# Patient Record
Sex: Female | Born: 1960 | Race: Black or African American | Hispanic: No | Marital: Married | Smoking: Never smoker
Health system: Southern US, Community
[De-identification: ages and names within clinical notes are randomized; demographics above are authoritative.]

## PROBLEM LIST (undated history)

## (undated) DIAGNOSIS — T7840XA Allergy, unspecified, initial encounter: Secondary | ICD-10-CM

## (undated) DIAGNOSIS — M1990 Unspecified osteoarthritis, unspecified site: Secondary | ICD-10-CM

## (undated) DIAGNOSIS — R0602 Shortness of breath: Secondary | ICD-10-CM

## (undated) DIAGNOSIS — D649 Anemia, unspecified: Secondary | ICD-10-CM

## (undated) DIAGNOSIS — K219 Gastro-esophageal reflux disease without esophagitis: Secondary | ICD-10-CM

## (undated) DIAGNOSIS — R931 Abnormal findings on diagnostic imaging of heart and coronary circulation: Secondary | ICD-10-CM

## (undated) DIAGNOSIS — F419 Anxiety disorder, unspecified: Secondary | ICD-10-CM

## (undated) DIAGNOSIS — I10 Essential (primary) hypertension: Secondary | ICD-10-CM

## (undated) DIAGNOSIS — G5600 Carpal tunnel syndrome, unspecified upper limb: Secondary | ICD-10-CM

## (undated) HISTORY — DX: Allergy, unspecified, initial encounter: T78.40XA

## (undated) HISTORY — PX: OTHER SURGICAL HISTORY: SHX169

## (undated) HISTORY — PX: BACK SURGERY: SHX140

## (undated) HISTORY — PX: ANTERIOR CRUCIATE LIGAMENT REPAIR: SHX115

## (undated) HISTORY — DX: Unspecified osteoarthritis, unspecified site: M19.90

## (undated) HISTORY — PX: NECK SURGERY: SHX720

## (undated) HISTORY — DX: Abnormal findings on diagnostic imaging of heart and coronary circulation: R93.1

## (undated) HISTORY — DX: Essential (primary) hypertension: I10

## (undated) HISTORY — DX: Anxiety disorder, unspecified: F41.9

## (undated) HISTORY — PX: COLONOSCOPY: SHX174

---

## 1997-11-10 ENCOUNTER — Emergency Department (HOSPITAL_COMMUNITY): Admission: EM | Admit: 1997-11-10 | Discharge: 1997-11-10 | Payer: Self-pay | Admitting: Emergency Medicine

## 1997-12-26 ENCOUNTER — Other Ambulatory Visit: Admission: RE | Admit: 1997-12-26 | Discharge: 1997-12-26 | Payer: Self-pay | Admitting: Obstetrics

## 1998-06-28 ENCOUNTER — Emergency Department (HOSPITAL_COMMUNITY): Admission: EM | Admit: 1998-06-28 | Discharge: 1998-06-28 | Payer: Self-pay | Admitting: Emergency Medicine

## 1998-10-01 ENCOUNTER — Other Ambulatory Visit: Admission: RE | Admit: 1998-10-01 | Discharge: 1998-10-01 | Payer: Self-pay | Admitting: Obstetrics

## 2000-08-16 ENCOUNTER — Other Ambulatory Visit: Admission: RE | Admit: 2000-08-16 | Discharge: 2000-08-16 | Payer: Self-pay | Admitting: Obstetrics

## 2000-08-27 ENCOUNTER — Ambulatory Visit (HOSPITAL_COMMUNITY): Admission: RE | Admit: 2000-08-27 | Discharge: 2000-08-27 | Payer: Self-pay | Admitting: Obstetrics

## 2000-08-27 ENCOUNTER — Encounter: Payer: Self-pay | Admitting: Obstetrics

## 2002-01-24 ENCOUNTER — Encounter: Payer: Self-pay | Admitting: Emergency Medicine

## 2002-01-24 ENCOUNTER — Emergency Department (HOSPITAL_COMMUNITY): Admission: EM | Admit: 2002-01-24 | Discharge: 2002-01-24 | Payer: Self-pay | Admitting: Emergency Medicine

## 2002-02-21 ENCOUNTER — Encounter: Payer: Self-pay | Admitting: Obstetrics

## 2002-02-21 ENCOUNTER — Ambulatory Visit (HOSPITAL_COMMUNITY): Admission: RE | Admit: 2002-02-21 | Discharge: 2002-02-21 | Payer: Self-pay | Admitting: Obstetrics

## 2002-05-03 ENCOUNTER — Ambulatory Visit (HOSPITAL_COMMUNITY): Admission: RE | Admit: 2002-05-03 | Discharge: 2002-05-03 | Payer: Self-pay | Admitting: Neurology

## 2002-12-03 ENCOUNTER — Emergency Department (HOSPITAL_COMMUNITY): Admission: EM | Admit: 2002-12-03 | Discharge: 2002-12-03 | Payer: Self-pay | Admitting: *Deleted

## 2002-12-11 ENCOUNTER — Encounter: Admission: RE | Admit: 2002-12-11 | Discharge: 2003-03-11 | Payer: Self-pay | Admitting: Orthopedic Surgery

## 2003-02-23 ENCOUNTER — Encounter: Payer: Self-pay | Admitting: Obstetrics

## 2003-02-23 ENCOUNTER — Ambulatory Visit (HOSPITAL_COMMUNITY): Admission: RE | Admit: 2003-02-23 | Discharge: 2003-02-23 | Payer: Self-pay | Admitting: Obstetrics

## 2003-07-23 ENCOUNTER — Inpatient Hospital Stay (HOSPITAL_COMMUNITY): Admission: RE | Admit: 2003-07-23 | Discharge: 2003-07-24 | Payer: Self-pay | Admitting: Neurosurgery

## 2003-07-25 ENCOUNTER — Emergency Department (HOSPITAL_COMMUNITY): Admission: EM | Admit: 2003-07-25 | Discharge: 2003-07-25 | Payer: Self-pay | Admitting: Emergency Medicine

## 2003-09-12 ENCOUNTER — Encounter: Admission: RE | Admit: 2003-09-12 | Discharge: 2003-09-12 | Payer: Self-pay | Admitting: Neurosurgery

## 2004-02-20 ENCOUNTER — Encounter (INDEPENDENT_AMBULATORY_CARE_PROVIDER_SITE_OTHER): Payer: Self-pay | Admitting: *Deleted

## 2004-02-20 ENCOUNTER — Ambulatory Visit (HOSPITAL_COMMUNITY): Admission: RE | Admit: 2004-02-20 | Discharge: 2004-02-20 | Payer: Self-pay | Admitting: Obstetrics

## 2004-05-12 ENCOUNTER — Ambulatory Visit: Payer: Self-pay | Admitting: Family Medicine

## 2004-06-05 ENCOUNTER — Ambulatory Visit: Payer: Self-pay | Admitting: Internal Medicine

## 2004-06-23 ENCOUNTER — Ambulatory Visit: Payer: Self-pay | Admitting: Family Medicine

## 2004-06-26 ENCOUNTER — Ambulatory Visit: Payer: Self-pay | Admitting: Family Medicine

## 2004-06-27 ENCOUNTER — Encounter: Admission: RE | Admit: 2004-06-27 | Discharge: 2004-08-15 | Payer: Self-pay | Admitting: Internal Medicine

## 2004-07-08 ENCOUNTER — Ambulatory Visit (HOSPITAL_COMMUNITY): Admission: RE | Admit: 2004-07-08 | Discharge: 2004-07-08 | Payer: Self-pay | Admitting: Obstetrics

## 2004-07-15 ENCOUNTER — Ambulatory Visit: Payer: Self-pay | Admitting: Family Medicine

## 2004-07-23 ENCOUNTER — Ambulatory Visit: Payer: Self-pay | Admitting: Family Medicine

## 2004-08-10 HISTORY — PX: ENDOMETRIAL ABLATION: SHX621

## 2004-08-19 ENCOUNTER — Ambulatory Visit: Payer: Self-pay | Admitting: Family Medicine

## 2004-09-30 ENCOUNTER — Ambulatory Visit: Payer: Self-pay | Admitting: Family Medicine

## 2004-10-29 ENCOUNTER — Ambulatory Visit: Payer: Self-pay | Admitting: Family Medicine

## 2004-12-10 ENCOUNTER — Ambulatory Visit: Payer: Self-pay | Admitting: Family Medicine

## 2005-02-18 ENCOUNTER — Emergency Department (HOSPITAL_COMMUNITY): Admission: EM | Admit: 2005-02-18 | Discharge: 2005-02-18 | Payer: Self-pay | Admitting: Emergency Medicine

## 2005-03-13 ENCOUNTER — Ambulatory Visit: Payer: Self-pay | Admitting: Family Medicine

## 2005-04-01 ENCOUNTER — Ambulatory Visit: Payer: Self-pay | Admitting: Family Medicine

## 2005-05-08 ENCOUNTER — Ambulatory Visit: Payer: Self-pay | Admitting: Family Medicine

## 2005-05-11 ENCOUNTER — Ambulatory Visit (HOSPITAL_COMMUNITY): Admission: RE | Admit: 2005-05-11 | Discharge: 2005-05-11 | Payer: Self-pay | Admitting: Family Medicine

## 2005-06-03 ENCOUNTER — Ambulatory Visit: Payer: Self-pay | Admitting: Family Medicine

## 2005-06-04 ENCOUNTER — Ambulatory Visit: Payer: Self-pay | Admitting: Family Medicine

## 2005-06-09 ENCOUNTER — Encounter: Admission: RE | Admit: 2005-06-09 | Discharge: 2005-06-09 | Payer: Self-pay | Admitting: Neurosurgery

## 2005-07-08 ENCOUNTER — Ambulatory Visit: Payer: Self-pay | Admitting: Internal Medicine

## 2005-10-01 ENCOUNTER — Ambulatory Visit: Payer: Self-pay | Admitting: Family Medicine

## 2006-01-14 ENCOUNTER — Ambulatory Visit: Payer: Self-pay | Admitting: Family Medicine

## 2006-06-01 ENCOUNTER — Ambulatory Visit: Payer: Self-pay | Admitting: Family Medicine

## 2006-07-09 ENCOUNTER — Ambulatory Visit (HOSPITAL_COMMUNITY): Admission: RE | Admit: 2006-07-09 | Discharge: 2006-07-09 | Payer: Self-pay | Admitting: Obstetrics

## 2006-08-16 ENCOUNTER — Ambulatory Visit: Payer: Self-pay | Admitting: Family Medicine

## 2006-09-07 ENCOUNTER — Ambulatory Visit: Payer: Self-pay | Admitting: Family Medicine

## 2007-03-07 ENCOUNTER — Ambulatory Visit: Payer: Self-pay | Admitting: Family Medicine

## 2007-05-11 DIAGNOSIS — F341 Dysthymic disorder: Secondary | ICD-10-CM | POA: Insufficient documentation

## 2007-05-11 DIAGNOSIS — K219 Gastro-esophageal reflux disease without esophagitis: Secondary | ICD-10-CM

## 2007-05-11 DIAGNOSIS — G44209 Tension-type headache, unspecified, not intractable: Secondary | ICD-10-CM | POA: Insufficient documentation

## 2007-05-11 HISTORY — DX: Gastro-esophageal reflux disease without esophagitis: K21.9

## 2007-06-08 ENCOUNTER — Ambulatory Visit: Payer: Self-pay | Admitting: Family Medicine

## 2007-07-12 ENCOUNTER — Ambulatory Visit (HOSPITAL_COMMUNITY): Admission: RE | Admit: 2007-07-12 | Discharge: 2007-07-12 | Payer: Self-pay | Admitting: Obstetrics

## 2008-03-23 ENCOUNTER — Ambulatory Visit: Payer: Self-pay | Admitting: Nurse Practitioner

## 2008-03-23 DIAGNOSIS — A599 Trichomoniasis, unspecified: Secondary | ICD-10-CM | POA: Insufficient documentation

## 2008-03-23 HISTORY — DX: Trichomoniasis, unspecified: A59.9

## 2008-03-23 LAB — CONVERTED CEMR LAB
Bilirubin Urine: NEGATIVE
Glucose, Urine, Semiquant: NEGATIVE
KOH Prep: NEGATIVE
Ketones, urine, test strip: NEGATIVE
Nitrite: NEGATIVE
Specific Gravity, Urine: >=1.03
Urobilinogen, UA: 0.2

## 2008-06-08 ENCOUNTER — Ambulatory Visit: Payer: Self-pay | Admitting: Family Medicine

## 2008-08-27 ENCOUNTER — Telehealth (INDEPENDENT_AMBULATORY_CARE_PROVIDER_SITE_OTHER): Payer: Self-pay | Admitting: *Deleted

## 2008-08-27 ENCOUNTER — Ambulatory Visit: Payer: Self-pay | Admitting: Nurse Practitioner

## 2008-08-27 DIAGNOSIS — J040 Acute laryngitis: Secondary | ICD-10-CM | POA: Insufficient documentation

## 2008-08-27 DIAGNOSIS — J45909 Unspecified asthma, uncomplicated: Secondary | ICD-10-CM | POA: Insufficient documentation

## 2008-08-27 HISTORY — DX: Acute laryngitis: J04.0

## 2008-10-24 ENCOUNTER — Encounter (INDEPENDENT_AMBULATORY_CARE_PROVIDER_SITE_OTHER): Payer: Self-pay | Admitting: Family Medicine

## 2008-10-24 ENCOUNTER — Ambulatory Visit: Payer: Self-pay | Admitting: Family Medicine

## 2008-10-24 DIAGNOSIS — H409 Unspecified glaucoma: Secondary | ICD-10-CM | POA: Insufficient documentation

## 2008-10-24 LAB — CONVERTED CEMR LAB
Bilirubin Urine: NEGATIVE
Glucose, Urine, Semiquant: NEGATIVE
Nitrite: NEGATIVE
Specific Gravity, Urine: 1.025
pH: 5.5

## 2008-10-30 ENCOUNTER — Ambulatory Visit (HOSPITAL_COMMUNITY): Admission: RE | Admit: 2008-10-30 | Discharge: 2008-10-30 | Payer: Self-pay | Admitting: Family Medicine

## 2008-10-31 LAB — CONVERTED CEMR LAB
AST: 22 units/L (ref 0–37)
Basophils Relative: 0 % (ref 0–1)
CO2: 22 meq/L (ref 19–32)
Chlamydia, DNA Probe: NEGATIVE
Cholesterol: 171 mg/dL (ref 0–200)
Eosinophils Absolute: 0.1 10*3/uL (ref 0.0–0.7)
HCT: 33.8 % — ABNORMAL LOW (ref 36.0–46.0)
LDL Cholesterol: 85 mg/dL (ref 0–99)
Lymphs Abs: 1.6 10*3/uL (ref 0.7–4.0)
MCV: 69.4 fL — ABNORMAL LOW (ref 78.0–100.0)
Monocytes Absolute: 0.4 10*3/uL (ref 0.1–1.0)
Neutrophils Relative %: 58 % (ref 43–77)
Potassium: 4.8 meq/L (ref 3.5–5.3)
RBC: 4.87 M/uL (ref 3.87–5.11)
RDW: 15.7 % — ABNORMAL HIGH (ref 11.5–15.5)
Sodium: 140 meq/L (ref 135–145)
Total CHOL/HDL Ratio: 2.4
Triglycerides: 73 mg/dL (ref <150)

## 2009-05-29 ENCOUNTER — Ambulatory Visit: Payer: Self-pay | Admitting: Physician Assistant

## 2009-05-29 DIAGNOSIS — J069 Acute upper respiratory infection, unspecified: Secondary | ICD-10-CM

## 2009-05-29 HISTORY — DX: Acute upper respiratory infection, unspecified: J06.9

## 2009-05-29 LAB — CONVERTED CEMR LAB: Rapid Strep: NEGATIVE

## 2009-06-05 ENCOUNTER — Ambulatory Visit: Payer: Self-pay | Admitting: Physician Assistant

## 2009-06-14 ENCOUNTER — Ambulatory Visit: Payer: Self-pay | Admitting: *Deleted

## 2009-10-14 ENCOUNTER — Emergency Department (HOSPITAL_COMMUNITY): Admission: EM | Admit: 2009-10-14 | Discharge: 2009-10-14 | Payer: Self-pay | Admitting: Emergency Medicine

## 2009-10-29 ENCOUNTER — Encounter: Admission: RE | Admit: 2009-10-29 | Discharge: 2009-10-29 | Payer: Self-pay | Admitting: Neurosurgery

## 2009-11-04 ENCOUNTER — Ambulatory Visit (HOSPITAL_COMMUNITY): Admission: RE | Admit: 2009-11-04 | Discharge: 2009-11-04 | Payer: Self-pay | Admitting: Neurosurgery

## 2009-11-11 ENCOUNTER — Telehealth: Payer: Self-pay | Admitting: Physician Assistant

## 2009-11-11 ENCOUNTER — Ambulatory Visit: Payer: Self-pay | Admitting: Physician Assistant

## 2009-11-11 DIAGNOSIS — Z862 Personal history of diseases of the blood and blood-forming organs and certain disorders involving the immune mechanism: Secondary | ICD-10-CM | POA: Insufficient documentation

## 2009-11-11 LAB — CONVERTED CEMR LAB
OCCULT 1: NEGATIVE
Pap Smear: NEGATIVE
Protein, U semiquant: NEGATIVE

## 2009-11-13 ENCOUNTER — Encounter: Payer: Self-pay | Admitting: Physician Assistant

## 2009-11-13 LAB — CONVERTED CEMR LAB
Folate: 15.1 ng/mL
Saturation Ratios: 26 % (ref 20–55)

## 2009-11-15 ENCOUNTER — Encounter: Admission: RE | Admit: 2009-11-15 | Discharge: 2009-11-15 | Payer: Self-pay | Admitting: Internal Medicine

## 2009-11-15 ENCOUNTER — Encounter: Payer: Self-pay | Admitting: Physician Assistant

## 2009-11-18 ENCOUNTER — Encounter: Payer: Self-pay | Admitting: Physician Assistant

## 2009-11-20 ENCOUNTER — Telehealth: Payer: Self-pay | Admitting: Physician Assistant

## 2009-11-20 ENCOUNTER — Ambulatory Visit: Payer: Self-pay | Admitting: Physician Assistant

## 2009-11-20 ENCOUNTER — Encounter (INDEPENDENT_AMBULATORY_CARE_PROVIDER_SITE_OTHER): Payer: Self-pay | Admitting: *Deleted

## 2009-11-20 LAB — CONVERTED CEMR LAB
Hgb A2 Quant: 2.1 % — ABNORMAL LOW (ref 2.2–3.2)
Hgb A: 97.9 % — ABNORMAL HIGH (ref 96.8–97.8)
Hgb F Quant: 0 % (ref 0.0–2.0)
Hgb S Quant: 0 % (ref 0.0–0.0)

## 2009-11-21 ENCOUNTER — Telehealth: Payer: Self-pay | Admitting: Physician Assistant

## 2009-12-09 ENCOUNTER — Encounter (INDEPENDENT_AMBULATORY_CARE_PROVIDER_SITE_OTHER): Payer: Self-pay | Admitting: *Deleted

## 2009-12-09 LAB — CONVERTED CEMR LAB
ALT: 13 units/L (ref 0–35)
Alkaline Phosphatase: 68 units/L (ref 39–117)
Basophils Absolute: 0 10*3/uL (ref 0.0–0.1)
CO2: 22 meq/L (ref 19–32)
Chlamydia, DNA Probe: NEGATIVE
Chloride: 104 meq/L (ref 96–112)
Creatinine, Ser: 1 mg/dL (ref 0.40–1.20)
Eosinophils Absolute: 0.1 10*3/uL (ref 0.0–0.7)
GC Probe Amp, Genital: NEGATIVE
Glucose, Bld: 90 mg/dL (ref 70–99)
Hemoglobin: 11.3 g/dL — ABNORMAL LOW (ref 12.0–15.0)
MCHC: 31.2 g/dL (ref 30.0–36.0)
MCV: 72.1 fL — ABNORMAL LOW (ref 78.0–100.0)
Monocytes Relative: 8 % (ref 3–12)
Platelets: 247 10*3/uL (ref 150–400)
Potassium: 5 meq/L (ref 3.5–5.3)
RBC: 5.02 M/uL (ref 3.87–5.11)
RDW: 15.6 % — ABNORMAL HIGH (ref 11.5–15.5)
Sodium: 140 meq/L (ref 135–145)
Total Bilirubin: 0.3 mg/dL (ref 0.3–1.2)
WBC: 4.8 10*3/uL (ref 4.0–10.5)

## 2010-03-03 ENCOUNTER — Telehealth: Payer: Self-pay | Admitting: Physician Assistant

## 2010-03-03 ENCOUNTER — Ambulatory Visit: Payer: Self-pay | Admitting: Physician Assistant

## 2010-03-03 ENCOUNTER — Encounter (INDEPENDENT_AMBULATORY_CARE_PROVIDER_SITE_OTHER): Payer: Self-pay | Admitting: *Deleted

## 2010-03-03 DIAGNOSIS — J029 Acute pharyngitis, unspecified: Secondary | ICD-10-CM

## 2010-03-03 HISTORY — DX: Acute pharyngitis, unspecified: J02.9

## 2010-04-01 ENCOUNTER — Telehealth: Payer: Self-pay | Admitting: Physician Assistant

## 2010-04-02 ENCOUNTER — Ambulatory Visit: Payer: Self-pay | Admitting: Nurse Practitioner

## 2010-04-23 ENCOUNTER — Telehealth: Payer: Self-pay | Admitting: Physician Assistant

## 2010-05-02 ENCOUNTER — Ambulatory Visit: Payer: Self-pay | Admitting: Physician Assistant

## 2010-05-02 DIAGNOSIS — M25539 Pain in unspecified wrist: Secondary | ICD-10-CM | POA: Insufficient documentation

## 2010-05-12 ENCOUNTER — Ambulatory Visit (HOSPITAL_COMMUNITY): Admission: RE | Admit: 2010-05-12 | Discharge: 2010-05-12 | Payer: Self-pay | Admitting: Physician Assistant

## 2010-05-16 ENCOUNTER — Ambulatory Visit: Payer: Self-pay | Admitting: Internal Medicine

## 2010-05-16 ENCOUNTER — Telehealth: Payer: Self-pay | Admitting: Physician Assistant

## 2010-06-03 ENCOUNTER — Ambulatory Visit: Payer: Self-pay | Admitting: Sports Medicine

## 2010-06-03 DIAGNOSIS — M65849 Other synovitis and tenosynovitis, unspecified hand: Secondary | ICD-10-CM

## 2010-06-03 DIAGNOSIS — M65839 Other synovitis and tenosynovitis, unspecified forearm: Secondary | ICD-10-CM | POA: Insufficient documentation

## 2010-06-04 ENCOUNTER — Encounter: Payer: Self-pay | Admitting: Family Medicine

## 2010-06-24 ENCOUNTER — Telehealth (INDEPENDENT_AMBULATORY_CARE_PROVIDER_SITE_OTHER): Payer: Self-pay | Admitting: Nurse Practitioner

## 2010-08-30 ENCOUNTER — Encounter: Payer: Self-pay | Admitting: Obstetrics

## 2010-08-31 ENCOUNTER — Encounter: Payer: Self-pay | Admitting: Internal Medicine

## 2010-08-31 ENCOUNTER — Encounter: Payer: Self-pay | Admitting: Obstetrics

## 2010-08-31 ENCOUNTER — Encounter: Payer: Self-pay | Admitting: Occupational Therapy

## 2010-09-09 NOTE — Assessment & Plan Note (Signed)
Summary: CPP EXAM//GK   Vital Signs:  Patient profile:   50 year old female Menstrual status:  last cycle 2006 novasure surgery Weight:      180.31 pounds BMI:     30.35 O2 Sat:      100 % on Room air Temp:     98 degrees F Pulse rate:   71 / minute Pulse rhythm:   regular Resp:     16 per minute BP sitting:   126 / 80  (left arm) Cuff size:   regular  Vitals Entered By: Chauncy Passy, SMA  O2 Flow:  Room air CC: Pt. is here for a 50 y/o CPP. Pt. is concnered about a lump under left breast.  Is Patient Diabetic? No  Does patient need assistance? Functional Status Self care Ambulation Normal Comments PF: 200; 210; 190   Primary Care Provider:  [name] PA-C  CC:  Pt. is here for a 50 y/o CPP. Pt. is concnered about a lump under left breast. .  History of Present Illness: Here for CPP.  Notes breast tenderness for about a week.  Notes a lump in her left breast.  Feels hard. No h/o abnormal mammo. Has a h/o abnormal pap.  Repeated it and it was ok. No abnormal bleeding.  Had novasure in 2006.  Has not had a period since.  Done by Dr. Gaynell Face. No odor or discharge. Td up to date. Has never had pneumovax. PHQ9=7 today.  Denies suicidal ideation.  Took meds once.  Does not feel like she needs meds again.  Open to seeing LCSW.   Asthma History    Initial Asthma Severity Rating:    Age range: 12+ years    Symptoms: >2 days/week; not daily    Nighttime Awakenings: >1/week but not nightly    Interferes w/ normal activity: minor limitations    SABA use (not for EIB): 0-2 days/week    Asthma Severity Assessment: Moderate Persistent  Problems Prior to Update: 1)  Anemia, Deficiency, Hx of  (ICD-V12.3) 2)  Uri  (ICD-465.9) 3)  Screening For Malignant Neoplasm, Cervix  (ICD-V76.2) 4)  Examination, Routine Medical  (ICD-V70.0) 5)  Unspecified Glaucoma  (ICD-365.9) 6)  Asthma  (ICD-493.90) 7)  Laryngitis, Acute  (ICD-464.00) 8)  Need Prophylactic  Vaccination&inoculation Flu  (ICD-V04.81) 9)  Trichomoniasis  (ICD-131.9) 10)  Headache, Tension  (ICD-307.81) 11)  Gastroesophageal Reflux Disease  (ICD-530.81) 12)  Anxiety Depression  (ICD-300.4)  Current Medications (verified): 1)  Vicodin .Marland Kitchen.. 1-2 Tablets 1-2 X Per Month From Dr.poole 2)  Ventolin Hfa 108 (90 Base) Mcg/act  Aers (Albuterol Sulfate) .... Take 2 Puffs Every 4-6 Hours As Needed 3)  Omeprazole 20 Mg Cpdr (Omeprazole) .... Take 1 Tablet By Mouth Each Day 4)  Advair Diskus 250-50 Mcg/dose Misc (Fluticasone-Salmeterol) .Marland Kitchen.. 1 Inhalation Two Times A Day 5)  Ferrous Sulfate 325 (65 Fe) Mg Tabs (Ferrous Sulfate) .... Take 1 Tablet By Mouth Every 12 Hours X 3 Months 6)  Tessalon Perles 100 Mg Caps (Benzonatate) .... Take 1 Tablet By Mouth Three Times A Day As Needed For Cough 7)  Tussionex Pennkinetic Er 8-10 Mg/66ml Lqcr (Chlorpheniramine-Hydrocodone) .Marland Kitchen.. 1 Tsp At Bedtime As Needed For Cough  Allergies (verified): 1)  ! Ultram  Past History:  Past Medical History: Last updated: 10/24/2008 Current Problems:  ASTHMA (ICD-493.90) HEADACHE, TENSION (ICD-307.81) GASTROESOPHAGEAL REFLUX DISEASE (ICD-530.81) ANXIETY DEPRESSION (ICD-300.4) GLAUCOMA age 63  Past Surgical History: Last updated: 10/24/2008 s/p endometrial ablation 2005 s/p neck surgeries  x 2.Marland Kitchen..06/1995,07/2003....Dr.Stern then Dr.Poole.  Family History: Mother died age 28 at ALS Father with Blind/Glaucoma,HTN,DM,ASCAD, MI (passed away at 74) 17 siblings....problems include...1 lung cancer,1 heart disease,5 siblings with LUPUS,DM,HTN,cholesterol,ASCAD.   No breast/uterine/ovarian/cervical/or colon cancer.  Social History: Occupation:Graphics designer Married in 01/28/2007 . . . separated (not sexually active) Never Smoked Alcohol use-yes...occasional beer on weekend (denies 11/2009) Drug use-yes....distant MJ in 20's  Review of Systems      See HPI General:  Denies chills and fever. CV:  Denies chest  pain or discomfort and fainting. Resp:  Denies cough. GI:  Denies bloody stools and dark tarry stools. GU:  Denies dysuria. Derm:  Denies lesion(s). Psych:  Denies suicidal thoughts/plans. Endo:  Denies cold intolerance and heat intolerance.  Physical Exam  General:  alert, well-developed, and well-nourished.   Head:  normocephalic and atraumatic.   Eyes:  pupils equal, pupils round, pupils reactive to light, and no optic disk abnormalities.   Ears:  R ear normal and L ear normal.   Nose:  no external deformity.   Mouth:  pharynx pink and moist.   Neck:  supple, no thyromegaly, no carotid bruits, and no cervical lymphadenopathy.   Breasts:  skin/areolae normal, no masses, no abnormal thickening, no nipple discharge, no tenderness, and no adenopathy.   Lungs:  normal breath sounds, no crackles, and no wheezes.   Heart:  normal rate, regular rhythm, and no murmur.   Abdomen:  soft, non-tender, normal bowel sounds, and no hepatomegaly.   Rectal:  no external abnormalities, no hemorrhoids, normal sphincter tone, no masses, and no tenderness.   Genitalia:  normal introitus, no external lesions, no vaginal discharge, mucosa pink and moist, no vaginal or cervical lesions, no vaginal atrophy, no friaility or hemorrhage, normal uterus size and position, and no adnexal masses or tenderness.   Msk:  normal ROM.   Pulses:  DP/PT 2+ bilat  Extremities:  no edema  Neurologic:  alert & oriented X3, cranial nerves II-XII intact, and DTRs symmetrical and normal.   Skin:  turgor normal.   Psych:  normally interactive and good eye contact.     Impression & Recommendations:  Problem # 1:  ANXIETY DEPRESSION (ICD-300.4)  PHQ9=7 refer to LCSW  Orders: Psychology Referral (Psychology)  Problem # 2:  ANEMIA, DEFICIENCY, HX OF (ICD-V12.3) no check on labs since 2010 repeat today  Orders: T-CBC w/Diff (04540-98119)  Problem # 3:  SCREENING FOR MALIGNANT NEOPLASM, CERVIX (ICD-V76.2) pap  today  Orders: T- GC Chlamydia (14782) T-Pap Smear, Thin Prep (95621) KOH/ WET Mount [phone])  Problem # 4:  EXAMINATION, ROUTINE MEDICAL (ICD-V70.0) get mammo discussed colo and she wants to proceed pneumovax  Orders: UA Dipstick w/o Micro (manual) (81002) Rapid HIV  (78469) T-Comprehensive Metabolic Panel (62952-84132) Mammogram (Screening) (Mammo) Hemoccult Cards -3 specimans (take home) (82272) Hemoccult Guaiac-1 spec.(in office) (82270) Gastroenterology Referral (GI)  Problem # 5:  ASTHMA (ICD-493.90) not taking advair as directed asked her to get back on it two times a day will also add claritin to see if this helps as she has more symptoms at work (dusty environment)  Her updated medication list for this problem includes:    Ventolin Hfa 108 (90 Base) Mcg/act Aers (Albuterol sulfate) .Marland Kitchen... Take 2 puffs every 4-6 hours as needed    Advair Diskus 250-50 Mcg/dose Misc (Fluticasone-salmeterol) .Marland Kitchen... 1 inhalation two times a day  Complete Medication List: 1)  Vicodin  .Marland Kitchen.. 1-2 tablets 1-2 x per month from dr.poole 2)  Ventolin Hfa 108 (  90 Base) Mcg/act Aers (Albuterol sulfate) .... Take 2 puffs every 4-6 hours as needed 3)  Omeprazole 20 Mg Cpdr (Omeprazole) .... Take 1 tablet by mouth each day 4)  Advair Diskus 250-50 Mcg/dose Misc (Fluticasone-salmeterol) .Marland Kitchen.. 1 inhalation two times a day 5)  Ferrous Sulfate 325 (65 Fe) Mg Tabs (Ferrous sulfate) .... Take 1 tablet by mouth every 12 hours x 3 months 6)  Tessalon Perles 100 Mg Caps (Benzonatate) .... Take 1 tablet by mouth three times a day as needed for cough 7)  Tussionex Pennkinetic Er 8-10 Mg/44ml Lqcr (Chlorpheniramine-hydrocodone) .Marland Kitchen.. 1 tsp at bedtime as needed for cough 8)  Claritin 10 Mg Tabs (Loratadine) .... Take 1 tablet by mouth once a day as needed for allergies  Other Orders: Pneumococcal Vaccine (56213) Admin 1st Vaccine (08657) Admin 1st Vaccine [facility]) [phone])   Patient Instructions: 1)  Pneumovax  today. 2)  Please schedule a follow-up appointment in 3 months with Scott for asthma. 3)  Make sure you take the advair two times a day every day. 4)  Use proventil as needed. 5)  Try the claritin to see if this helps as well. 6)  We will set you up to see the gastroenterologist for your colonoscopy. 7)  Complete the stool cards and return. 8)  Make sure you follow up with your eye doctor for your glaucoma. Prescriptions: CLARITIN 10 MG TABS (LORATADINE) Take 1 tablet by mouth once a day as needed for allergies  #30 x 5   Entered and Authorized by:   [name] PA-C   Signed by:   [name] PA-C on 11/11/2009   Method used:   Print then Give to Patient   RxID:   [id number] VENTOLIN HFA 108 (90 BASE) MCG/ACT  AERS (ALBUTEROL SULFATE) Take 2 puffs every 4-6 hours as needed  #1 x 11   Entered and Authorized by:   [name] PA-C   Signed by:   [name] PA-C on 11/11/2009   Method used:   Print then Give to Patient   RxID:   [id number] ADVAIR DISKUS 250-50 MCG/DOSE MISC (FLUTICASONE-SALMETEROL) 1 inhalation two times a day  #1 x 5   Entered and Authorized by:   [name] PA-C   Signed by:   [name] PA-C on 11/11/2009   Method used:   Print then Give to Patient   RxID:   [id number] OMEPRAZOLE 20 MG CPDR (OMEPRAZOLE) Take 1 tablet by mouth each day  #30 x 5   Entered and Authorized by:   [name] PA-C   Signed by:   [name] PA-C on 11/11/2009   Method used:   Print then Give to Patient   RxID:   [id number]   Laboratory Results   Urine Tests    Routine Urinalysis   Glucose: negative   (Normal Range: Negative) Bilirubin: negative   (Normal Range: Negative) Ketone: negative   (Normal Range: Negative) Spec. Gravity: >=1.030   (Normal Range: 1.003-1.035) Blood: negative   (Normal Range: Negative) pH: 5.5   (Normal Range: 5.0-8.0) Protein: negative   (Normal Range: Negative) Urobilinogen: 0.2   (Normal Range: 0-1) Nitrite: negative    (Normal Range: Negative) Leukocyte Esterace: negative   (Normal Range: Negative)      Wet Mount Source: vaginal WBC/hpf: 1-5 Bacteria/hpf: 2+  Rods Clue cells/hpf: none Yeast/hpf: none Wet Mount KOH: Negative Trichomonas/hpf: none  Stool - Occult Blood Hemmoccult #1: negative Date: 11/11/2009     Pneumovax Vaccine    Vaccine  Type: Pneumovax    Site: left deltoid    Mfr: Merck    Dose: 0.5 ml    Route: IM    Given by: Chauncy Passy, SMA    Exp. Date: 09/05/2010    Lot #: [id number]    VIS given: 03/07/96 version given November 11, 2009.   Appended Document: Hemoccult Results  Laboratory Results    Stool - Occult Blood Hemmoccult #1: negative Date: 11/20/2009 Hemoccult #2: negative Date: 11/20/2009 Hemoccult #3: negative Date: 11/20/2009

## 2010-09-09 NOTE — Miscellaneous (Signed)
Summary: Anemia update  Clinical Lists Changes  Problems: Assessed ANEMIA, DEFICIENCY, HX OF as comment only - patient on iron iron levels, b12 and folate all ok microcytic anemia  . . . .mild hgb electrophoresis was normal retic count pending stool cards pending GI referral pending for screening colo.       Impression & Recommendations:  Problem # 1:  ANEMIA, DEFICIENCY, HX OF (ICD-V12.3) patient on iron iron levels, b12 and folate all ok microcytic anemia  . . . .mild hgb electrophoresis was normal retic count pending stool cards pending GI referral pending for screening colo.  Complete Medication List: 1)  Vicodin  .Marland Kitchen.. 1-2 tablets 1-2 x per month from dr.poole 2)  Ventolin Hfa 108 (90 Base) Mcg/act Aers (Albuterol sulfate) .... Take 2 puffs every 4-6 hours as needed 3)  Omeprazole 20 Mg Cpdr (Omeprazole) .... Take 1 tablet by mouth each day 4)  Advair Diskus 250-50 Mcg/dose Misc (Fluticasone-salmeterol) .Marland Kitchen.. 1 inhalation two times a day 5)  Ferrous Sulfate 325 (65 Fe) Mg Tabs (Ferrous sulfate) .... Take 1 tablet by mouth every 12 hours x 3 months 6)  Tessalon Perles 100 Mg Caps (Benzonatate) .... Take 1 tablet by mouth three times a day as needed for cough 7)  Tussionex Pennkinetic Er 8-10 Mg/25ml Lqcr (Chlorpheniramine-hydrocodone) .Marland Kitchen.. 1 tsp at bedtime as needed for cough 8)  Claritin 10 Mg Tabs (Loratadine) .... Take 1 tablet by mouth once a day as needed for allergies  Appended Document: Anemia update    Clinical Lists Changes  Problems: Assessed ANEMIA, DEFICIENCY, HX OF as comment only - stool cards negative         Impression & Recommendations:  Problem # 1:  ANEMIA, DEFICIENCY, HX OF (ICD-V12.3) stool cards negative  Complete Medication List: 1)  Vicodin  .Marland Kitchen.. 1-2 tablets 1-2 x per month from dr.poole 2)  Ventolin Hfa 108 (90 Base) Mcg/act Aers (Albuterol sulfate) .... Take 2 puffs every 4-6 hours as needed 3)  Omeprazole 20 Mg Cpdr  (Omeprazole) .... Take 1 tablet by mouth each day 4)  Advair Diskus 250-50 Mcg/dose Misc (Fluticasone-salmeterol) .Marland Kitchen.. 1 inhalation two times a day 5)  Ferrous Sulfate 325 (65 Fe) Mg Tabs (Ferrous sulfate) .... Take 1 tablet by mouth every 12 hours x 3 months 6)  Tessalon Perles 100 Mg Caps (Benzonatate) .... Take 1 tablet by mouth three times a day as needed for cough 7)  Tussionex Pennkinetic Er 8-10 Mg/59ml Lqcr (Chlorpheniramine-hydrocodone) .Marland Kitchen.. 1 tsp at bedtime as needed for cough 8)  Claritin 10 Mg Tabs (Loratadine) .... Take 1 tablet by mouth once a day as needed for allergies  Appended Document: Anemia update    Clinical Lists Changes  Problems: Assessed ANEMIA, DEFICIENCY, HX OF as comment only - retic count normal w/u so far unremarkable  rec she continue iron and see GI for screening colo         Impression & Recommendations:  Problem # 1:  ANEMIA, DEFICIENCY, HX OF (ICD-V12.3) retic count normal w/u so far unremarkable  rec she continue iron and see GI for screening colo  Complete Medication List: 1)  Vicodin  .Marland Kitchen.. 1-2 tablets 1-2 x per month from dr.poole 2)  Ventolin Hfa 108 (90 Base) Mcg/act Aers (Albuterol sulfate) .... Take 2 puffs every 4-6 hours as needed 3)  Omeprazole 20 Mg Cpdr (Omeprazole) .... Take 1 tablet by mouth each day 4)  Advair Diskus 250-50 Mcg/dose Misc (Fluticasone-salmeterol) .Marland Kitchen.. 1 inhalation two times a day 5)  Ferrous Sulfate 325 (65 Fe) Mg Tabs (Ferrous sulfate) .... Take 1 tablet by mouth every 12 hours x 3 months 6)  Tessalon Perles 100 Mg Caps (Benzonatate) .... Take 1 tablet by mouth three times a day as needed for cough 7)  Tussionex Pennkinetic Er 8-10 Mg/72ml Lqcr (Chlorpheniramine-hydrocodone) .Marland Kitchen.. 1 tsp at bedtime as needed for cough 8)  Claritin 10 Mg Tabs (Loratadine) .... Take 1 tablet by mouth once a day as needed for allergies

## 2010-09-09 NOTE — Assessment & Plan Note (Signed)
Summary: Acute - URI   Vital Signs:  Patient profile:   50 year old female Menstrual status:  last cycle 2006 novasure surgery Weight:      176.8 pounds O2 Sat:      100 % on Room air Temp:     97.1 degrees F oral Pulse rate:   78 / minute Pulse rhythm:   regular Resp:     20 per minute BP sitting:   137 / 80  (left arm) Cuff size:   regular  Vitals Entered By: Levon Hedger (April 02, 2010 10:37 AM)  O2 Flow:  Room air CC: hacking cough, yellow phlem, chest heaviness hard to breath, tickle in throat x 1 week Is Patient Diabetic? No Pain Assessment Patient in pain? no       Does patient need assistance? Functional Status Self care Ambulation Normal   Primary Care Provider:  [name] PA-C  CC:  hacking cough, yellow phlem, chest heaviness hard to breath, and tickle in throat x 1 week.  History of Present Illness:  Pt into into office with complaints of cough and increasing worsing of upper respiratory symtpoms Started 1 week ago cough and scratchy throat (cough dry at that time) then 3 days later the cough became productive and voice became hoarse. 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  She left work to come to this appt which she was able to get this morning -tobacco use  Daugher with similar symptoms but pt does not live with her and has not seen her since onset of symptoms.  Allergies (verified): 1)  ! Ultram  Review of Systems General:  Complains of chills and sweats. ENT:  Denies earache, nasal congestion, and sore throat; +scrathy throat. CV:  Denies chest pain or discomfort and fatigue. Resp:  Complains of cough; denies wheezing. GI:  Denies abdominal pain, nausea,  and vomiting.  Physical Exam  General:  alert.   Head:  normocephalic.   Ears:  right ear - cloudy fluid left ear - TM visible, bony landmarks present Lungs:  No wheezes, clear Heart:  normal rate and regular rhythm.   Abdomen:  normal bowel sounds.   Msk:  up to the exam table Neurologic:  alert & oriented X3.   Cervical Nodes:  shotty cervical LAD Psych:  Oriented X3.     Impression & Recommendations:  Problem # 1:  URI (ICD-465.9) symptoms ongoing will treat with antibiotic as pt has already tried multiple otc meds  continue other medications as already ordered The following medications were removed from the medication list:    Tessalon Perles 100 Mg Caps (Benzonatate) .Marland Kitchen... Take 1 tablet by mouth three times a day as needed for cough    Tussionex Pennkinetic Er 8-10 Mg/49ml Lqcr (Chlorpheniramine-hydrocodone) .Marland Kitchen... 1 tsp at bedtime as needed for cough Her updated medication list for this problem includes:    Claritin 10 Mg Tabs (Loratadine) .Marland Kitchen... Take 1 tablet by mouth once a day as needed for allergies    Aleve 220 Mg Tabs (Naproxen sodium) .Marland Kitchen..Marland Kitchen Two tablets daily for three days  Orders: Pulse Oximetry (single measurment) (29562)  Complete Medication List: 1)  Vicodin  .Marland Kitchen.. 1-2 tablets 1-2 x per month from dr.poole 2)  Ventolin Hfa  108 (90 Base) Mcg/act Aers (Albuterol sulfate) .... Take 2 puffs every 4-6 hours as needed 3)  Omeprazole 20 Mg Cpdr (Omeprazole) .... Take 1 tablet by mouth each day 4)  Advair Diskus 250-50 Mcg/dose Misc (Fluticasone-salmeterol) .Marland Kitchen.. 1 inhalation two times a day 5)  Ferrous Sulfate 325 (65 Fe) Mg Tabs (Ferrous sulfate) .... Take 1 tablet by mouth every 12 hours x 3 months 6)  Claritin 10 Mg Tabs (Loratadine) .... Take 1 tablet by mouth once a day as needed for allergies 7)  Aleve 220 Mg Tabs (Naproxen sodium) .... Two tablets daily for three days 8)  Lidocaine Viscous 2 % Soln (Lidocaine hcl) .Marland Kitchen.. 1 teaspoon three times a day as needed for  sore throat 9)  Amoxicillin 500 Mg Caps (Amoxicillin) .... One capsule by mouth three times a day for infection  Patient Instructions: 1)  Upper Respiratory Infection - likely started off viral but with symptoms of sweats, chills, lymph node enlargment and cloudy fluid in right ear will treat with amoxil 500mg  by mouth three times a day for infection.   2)  Continue the advair and claritin as ordered 3)  Gargle with warm salty water  4)  Drink warm tea 5)  Follow up as needed Prescriptions: AMOXICILLIN 500 MG CAPS (AMOXICILLIN) One capsule by mouth three times a day for infection  #21 x 0   Entered and Authorized by:   Lehman Prom FNP   Signed by:   Lehman Prom FNP on 04/02/2010   Method used:   Print then Give to Patient   RxID:   [id number]

## 2010-09-09 NOTE — Progress Notes (Signed)
  Phone Note Outgoing Call   Summary of Call: Please notify patient that mammogram and ultrasound was normal. Initial call taken by: Brynda Rim,  November 20, 2009 1:48 PM  Follow-up for Phone Call        will mail letter.. [place] Shannon  November 20, 2009 4:24 PM

## 2010-09-09 NOTE — Progress Notes (Signed)
Summary: Pt is feeling very sikc / Please call her asap  Phone Note Call from Patient Call back at [phone]   Call For: [phone] Summary of Call: The pt has glands swollen (for almost two weeks).  The pt wants to be seen today if that is possible. Marca Gadsby PA Initial call taken by: Manon Hilding,  March 03, 2010 8:06 AM  Follow-up for Phone Call        Throat has been sore and raw for about 10 days, been taking Sudafed, thinking it was a sinus infection and nothing has helped, nasal drainage, yellowish/bloody sometimes.  Has post-nasal drainage.  Shortness of breath due to asthma, unable to measure peak flow.  Using rescue inhaler, makes her feel better for a short while.  Follow-up by: Dutch Quint RN,  March 03, 2010 12:33 PM  Additional Follow-up for Phone Call Additional follow up Details #1::        Appt. made for 03/03/10 with triage nurse. Additional Follow-up by: Dutch Quint RN,  March 03, 2010 1:01 PM    Additional Follow-up for Phone Call Additional follow up Details #2::    Pt. seen in office. Follow-up by: Dutch Quint RN,  March 03, 2010 5:18 PM

## 2010-09-09 NOTE — Assessment & Plan Note (Signed)
Summary: Left Wrist Pain.   Vital Signs:  Patient profile:   50 year old female Menstrual status:  last cycle 2006 novasure surgery Height:      64.75 inches Weight:      174 pounds BMI:     29.28 Temp:     97.6 degrees F oral Pulse rate:   78 / minute Pulse rhythm:   regular Resp:     20 per minute BP sitting:   118 / 78  (left arm)  Vitals Entered By: CMA Student Linzie Collin CC: office viist left hand hurts when in use, no strength in the left hand, onset for about a month, OTC taken aleve Pain Assessment Patient in pain? no       Does patient need assistance? Functional Status Self care Ambulation Normal   Primary Care Provider:  [name] PA-C  CC:  office viist left hand hurts when in use, no strength in the left hand, onset for about a month, and OTC taken aleve.  History of Present Illness: Reports one month of left wrist pain.  No injury.  No numbness or tingling.  Radicular symptoms from her neck problems usually involve her right arm.  Has carpal tunnel but this symptom is different.  She denies any red or swollen joints.  No fever or chills.  No rashes.  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  Pain worse with grasping and feels like strength in hand is decreased.  Feels pain radiating from wrist to knuckles (dorsal).  Problems Prior to Update: 1)  Wrist Pain, Left  (ICD-719.43) 2)  Pharyngitis, Viral  (ICD-462) 3)  Anemia, Deficiency, Hx of  (ICD-V12.3) 4)  Uri  (ICD-465.9) 5)  Screening For Malignant Neoplasm, Cervix  (ICD-V76.2) 6)  Examination, Routine Medical  (ICD-V70.0) 7)  Unspecified Glaucoma  (ICD-365.9) 8)  Asthma  (ICD-493.90) 9)  Laryngitis, Acute  (ICD-464.00) 10)  Need Prophylactic Vaccination&inoculation Flu  (ICD-V04.81) 11)  Trichomoniasis  (ICD-131.9) 12)  Headache, Tension  (ICD-307.81) 13)  Gastroesophageal Reflux Disease  (ICD-530.81) 14)  Anxiety Depression  (ICD-300.4)  Current Medications (verified): 1)   Vicodin .Marland Kitchen.. 1-2 Tablets 1-2 X Per Month From Dr.poole 2)  Ventolin Hfa 108 (90 Base) Mcg/act  Aers (Albuterol Sulfate) .... Take 2 Puffs Every 4-6 Hours As Needed 3)  Omeprazole 20 Mg Cpdr (Omeprazole) .... Take 1 Tablet By Mouth Each Day 4)  Advair Diskus 250-50 Mcg/dose Misc (Fluticasone-Salmeterol) .Marland Kitchen.. 1 Inhalation Two Times A Day 5)  Ferrous Sulfate 325 (65 Fe) Mg Tabs (Ferrous Sulfate) .... Take 1 Tablet By Mouth Every 12 Hours X 3 Months 6)  Claritin 10 Mg Tabs (Loratadine) .... Take 1 Tablet By Mouth Once A Day As Needed For Allergies 7)  Aleve 220 Mg Tabs (Naproxen Sodium) .... Two Tablets Daily For Three Days 8)  Lidocaine Viscous 2 % Soln (Lidocaine Hcl) .Marland Kitchen.. 1 Teaspoon Three Times A Day As Needed For Sore Throat 9)  Amoxicillin 500 Mg Caps (Amoxicillin) .... One Capsule By Mouth Three Times A Day For Infection  Allergies (verified): 1)  ! Ultram  Past History:  Past Medical History: Last updated: 10/24/2008 Current Problems:  ASTHMA (ICD-493.90) HEADACHE, TENSION (ICD-307.81) GASTROESOPHAGEAL REFLUX DISEASE (ICD-530.81) ANXIETY DEPRESSION (ICD-300.4) GLAUCOMA age 64  Physical Exam  General:  alert, well-developed, and well-nourished.   Head:  normocephalic and atraumatic.   Msk:  right hand and wrist: no deformity no eyrthema no swelling  Finkelstein negative + pain with active resistance  to forced flexion + pain with tenderness of carpal bones (thumb side) no snuff box tend  Neurologic:  alert & oriented X3 and cranial nerves II-XII intact.   grip strength reduced on left  Psych:  normally interactive.      Impression & Recommendations:  Problem # 1:  WRIST PAIN, LEFT (ICD-719.43)  with her work, could be overuse injury (wrist extensor tendonitis) has tenderness over scaphoid and lunate bones get xray put in thumb spica for 2 weeks cannot miss any work change nsaid (has been taking aleve) to nabumetone f/u in 2 weeks  Orders: Diagnostic  X-Ray/Fluoroscopy (Diagnostic X-Ray/Flu)  Complete Medication List: 1)  Vicodin  .Marland Kitchen.. 1-2 tablets 1-2 x per month from dr.poole 2)  Ventolin Hfa 108 (90 Base) Mcg/act Aers (Albuterol sulfate) .... Take 2 puffs every 4-6 hours as needed 3)  Omeprazole 20 Mg Cpdr (Omeprazole) .... Take 1 tablet by mouth each day 4)  Advair Diskus 250-50 Mcg/dose Misc (Fluticasone-salmeterol) .Marland Kitchen.. 1 inhalation two times a day 5)  Ferrous Sulfate 325 (65 Fe) Mg Tabs (Ferrous sulfate) .... Take 1 tablet by mouth every 12 hours x 3 months 6)  Claritin 10 Mg Tabs (Loratadine) .... Take 1 tablet by mouth once a day as needed for allergies 7)  Aleve 220 Mg Tabs (Naproxen sodium) .... Two tablets daily for three days 8)  Lidocaine Viscous 2 % Soln (Lidocaine hcl) .Marland Kitchen.. 1 teaspoon three times a day as needed for sore throat 9)  Amoxicillin 500 Mg Caps (Amoxicillin) .... One capsule by mouth three times a day for infection 10)  Nabumetone 500 Mg Tabs (Nabumetone) .Marland Kitchen.. 1-2 tabs by mouth two times a day as needed  Patient Instructions: 1)  Take the nabumetone with food two times a day every day for one week.  You can take 1 or 2, whichever works better.  Keep taking your omeprazole to protect your stomach.  After, one week, take it two times a day as needed for pain. 2)  Wear the brace I gave you all the time for 2 weeks.  You can take it off in the shower, but, otherwise, try to wear it all the time. 3)  Get the xray done. 4)  Schedule follow up with Adia Crammer in 2 weeks. Prescriptions: NABUMETONE 500 MG TABS (NABUMETONE) 1-2 tabs by mouth two times a day as needed  #60 x 1   Entered and Authorized by:   [name] PA-C   Signed by:   [name] PA-C on 05/02/2010   Method used:   Print then Give to Patient   RxID:   [id number]

## 2010-09-09 NOTE — Letter (Signed)
Summary: Handout Printed  Printed Handout:  - Viral Pharyngitis, Easy-to-Read 

## 2010-09-09 NOTE — Letter (Signed)
Summary: *HSN Results Follow up  [facility]  [address] [place], [place] [address]   Phone: [phone]  Fax: [phone]      11/20/2009   Teresa Mcmahon [address] [place], [place]  [address]   Dear  Ms. [name],                            ____S.Drinkard,FNP   ____D. Gore,FNP       ____B. McPherson,MD   ____V. Rankins,MD    ____E. Mulberry,MD    ____N. Daphine Deutscher, FNP  ____D. Reche Dixon, MD    ____K. Philipp Deputy, MD    __x__S. Alben Spittle, PA-C     This letter is to inform you that your recent test(s):  _______Pap Smear    _______Lab Test     _______X-ray    _______ is within acceptable limits  _______ requires a medication change  _______ requires a follow-up lab visit  _______ requires a follow-up visit with your provider   Comments: Stool cards negative for blood.       _________________________________________________________ If you have any questions, please contact our office                     Sincerely,  [name] PA-C [facility]

## 2010-09-09 NOTE — Assessment & Plan Note (Signed)
Summary: SORE THROAT///KT  Nurse Visit   Vital Signs:  Patient profile:   50 year old female Menstrual status:  last cycle 2006 novasure surgery Temp:     98.4 degrees F oral Pulse rate:   77 / minute Pulse rhythm:   regular Resp:     20 per minute BP sitting:   143 / 83  (right arm) Cuff size:   regular  Vitals Entered By: Dutch Quint RN (March 03, 2010 3:50 PM) CC: Sore throat Is Patient Diabetic? No Pain Assessment Patient in pain? yes     Location: throat Intensity: 10 Type: scratching Onset of pain  about 10 days ago.  Does patient need assistance? Functional Status Self care Ambulation Normal   Primary Care Provider:  [name] PA-C  CC:  Sore throat.  History of Present Illness: c/o swollen glands and that throat has been sore and raw for about 10 days, been taking Sudafed, thinking it was a sinus infection and nothing has helped, nasal drainage, yellowish/bloody sometimes.  Has post-nasal drainage.  Shortness of breath due to asthma, unable to measure peak flow.  Using rescue inhaler, makes her feel better for a short while.  Denies nausea or vomiting.   Review of Systems       fever of 101 over the weekend, chills.  Slightly light-headed, seeing black dots.  Headaches come and go, were strong for a couple of days.  Peak flow in office = 120 / 200 / 200 General:  Complains of chills, fatigue, fever, loss of appetite, malaise, and sleep disorder. Eyes:  Complains of discharge; Eyes have been matted for three days, Bloodshot Saturday, Sunday, the worst being yesterday, slightly improved this morning.  Seeing little black dots.. ENT:  Complains of difficulty swallowing, earache, hoarseness, nasal congestion, nosebleeds, postnasal drainage, sinus pressure, and sore throat; Ears feel "itchy", "irritated."  Popping sounds.  "Hacking", dry cough.  Ears appear clear, no discharge, no redness.  Nares clear, no swelling or drainage.Marland Kitchen Resp:  Complains of chest  discomfort, shortness of breath, and wheezing; States slight wheezing.  Marland Kitchen   Physical Exam  General:  alert, well-developed, well-nourished, and well-hydrated.   Mouth:  pharynx pink and moist and pharyngeal erythema.   Lungs:  normal respiratory effort, no intercostal retractions, no accessory muscle use, normal breath sounds, no dullness, no fremitus, no crackles, and no wheezes.     Impression & Recommendations:  Problem # 1:  PHARYNGITIS, VIRAL (ICD-462)  Her updated medication list for this problem includes:    Aleve 220 Mg Tabs (Naproxen sodium) .Marland Kitchen..Marland Kitchen Two tablets daily for three days  Complete Medication List: 1)  Vicodin  .Marland Kitchen.. 1-2 tablets 1-2 x per month from dr.poole 2)  Ventolin Hfa 108 (90 Base) Mcg/act Aers (Albuterol sulfate) .... Take 2 puffs every 4-6 hours as needed 3)  Omeprazole 20 Mg Cpdr (Omeprazole) .... Take 1 tablet by mouth each day 4)  Advair Diskus 250-50 Mcg/dose Misc (Fluticasone-salmeterol) .Marland Kitchen.. 1 inhalation two times a day 5)  Ferrous Sulfate 325 (65 Fe) Mg Tabs (Ferrous sulfate) .... Take 1 tablet by mouth every 12 hours x 3 months 6)  Tessalon Perles 100 Mg Caps (Benzonatate) .... Take 1 tablet by mouth three times a day as needed for cough 7)  Tussionex Pennkinetic Er 8-10 Mg/4ml Lqcr (Chlorpheniramine-hydrocodone) .Marland Kitchen.. 1 tsp at bedtime as needed for cough 8)  Claritin 10 Mg Tabs (Loratadine) .... Take 1 tablet by mouth once a day as needed for allergies 9)  Aleve 220 Mg Tabs (Naproxen sodium) .... Two tablets daily for three days 10)  Lidocaine Viscous 2 % Soln (Lidocaine hcl) .Marland Kitchen.. 1 teaspoon three times a day as needed for sore throat  Other Orders: Rapid Strep ([address])   Patient Instructions: 1)  Per N.Martin: 2)  Take Claritin (loratidine) 10 mg. by mouth daily. 3)  Take Alleve two tablets daily for three days with food (samples given). 4)  Gargle with viscous lidocaine, one teaspoon three times a day before meals, as needed for sore throat 5)   Gargle with warm salt water and drink warm tea. 6)  If symptoms get worse, follow-up with office by the end of the week.   Allergies: 1)  ! Ultram Laboratory Results  Date/Time Received: 03/03/10   4:15 pm  Other Tests  Rapid Strep: negative   Orders Added: 1)  Est. Patient Level II [[address]] 2)  Rapid Strep [[address]] Prescriptions: LIDOCAINE VISCOUS 2 % SOLN (LIDOCAINE HCL) 1 teaspoon three times a day as needed for sore throat  #2 oz. x 0   Entered by:   Dutch Quint RN   Authorized by:   Lehman Prom FNP   Signed by:   Dutch Quint RN on 03/03/2010   Method used:   Print then Give to Patient   RxID:   [id number] ALEVE 220 MG TABS (NAPROXEN SODIUM) Two tablets daily for three days  #6 x 0   Entered by:   Dutch Quint RN   Authorized by:   Lehman Prom FNP   Signed by:   Dutch Quint RN on 03/03/2010   Method used:   Samples Given   RxID:   [id number]

## 2010-09-09 NOTE — Letter (Signed)
Summary: *HSN Results Follow up  [facility]  [address] [place], [place] [address]   Phone: [phone]  Fax: [phone]      12/09/2009   JOANNE SALAH [address] [place], [place]  [address]   Dear  Ms. Vilda HERBIN,                            ____S.Drinkard,FNP   ____D. Gore,FNP       ____B. McPherson,MD   ____V. Rankins,MD    ____E. Mulberry,MD    ____N. Daphine Deutscher, FNP  ____D. Reche Dixon, MD    ____K. Philipp Deputy, MD    ____Other     This letter is to inform you that your recent test(s):  _______Pap Smear    _______Lab Test     _______X-ray    _______ is within acceptable limits  _______ requires a medication change  _______ requires a follow-up lab visit  _______ requires a follow-up visit with your provider   Comments:  We have been trying to reach you.  Please give the office a call at your earliest convenience.       _________________________________________________________ If you have any questions, please contact our office                     Sincerely,  [place] Shannon [facility]

## 2010-09-09 NOTE — Letter (Signed)
Summary: *Consult Note  [facility]  [address]   [place], [place] [address]   Phone: [phone]  Fax: [phone]    Re:    Teresa Mcmahon DOB:    April 03, 1961   Dear Teresa Newcomer, PA-C:    Thank you for requesting that we see the above patient for consultation.  A copy of the detailed office note will be sent under separate cover, for your review.  Evaluation today is consistent with:  1)  TENOSYNOVITIS, WRIST (ICD-727.05)   Our recommendation is for:  1) Recommended she rest wrist as much as possible, refused any work restrictions, but feels she is able to work ok without formal restrictions. 2) Continue using thumb spica brace at work, encouraged to sleep with thumb spica brace or other supportive wrist brace. 3) Refill on Nabumetone given 4) Expect this to improve if she can rest her wrist. 5) Follow-up with [place] in 3-4 weeks if no improvement.   New Orders include:  1)  Consultation Level II [99242]   New Medications started today include:  1)  NABUMETONE 500 MG TABS (NABUMETONE) 1-2 tabs by mouth two times a day with food prn   After today's visit, the patients current medications include: 1)  * VICODIN 1-2 tablets 1-2 x per month FROM DR.POOLE 2)  VENTOLIN HFA 108 (90 BASE) MCG/ACT  AERS (ALBUTEROL SULFATE) Take 2 puffs every 4-6 hours as needed 3)  OMEPRAZOLE 20 MG CPDR (OMEPRAZOLE) Take 1 tablet by mouth each day 4)  ADVAIR DISKUS 250-50 MCG/DOSE MISC (FLUTICASONE-SALMETEROL) 1 inhalation two times a day 5)  FERROUS SULFATE 325 (65 FE) MG TABS (FERROUS SULFATE) Take 1 tablet by mouth every 12 hours x 3 months 6)  CLARITIN 10 MG TABS (LORATADINE) Take 1 tablet by mouth once a day as needed for allergies 7)  LIDOCAINE VISCOUS 2 % SOLN (LIDOCAINE HCL) 1 teaspoon three times a day as needed for sore throat 8)  AMOXICILLIN 500 MG CAPS (AMOXICILLIN) One capsule by mouth three times a day for infection 9)  NABUMETONE 500 MG TABS (NABUMETONE)  1-2 tabs by mouth two times a day with food prn   Thank you for this consultation.  If you have any further questions regarding the care of this patient, please do not hesitate to contact me @ [phone].  Thank you for this opportunity to look after your patient.  Sincerely,   Darene Lamer DO [facility]

## 2010-09-09 NOTE — Letter (Signed)
Summary: *HSN Results Follow up  [facility]  [address] [place], [place] [address]   Phone: [phone]  Fax: [phone]      11/18/2009   Teresa Mcmahon [address] [place], [place]  [address]   Dear  Ms. Teresa Mcmahon,                            ____S.Drinkard,FNP   ____D. Gore,FNP       ____B. McPherson,MD   ____V. Rankins,MD    ____E. Mulberry,MD    ____N. Daphine Deutscher, FNP  ____D. Reche Dixon, MD    ____K. Philipp Deputy, MD    __x__S. Alben Spittle, PA-C     This letter is to inform you that your recent test(s):  ___x____Pap Smear    _______Lab Test     _______X-ray    ___x____ is within acceptable limits  _______ requires a medication change  _______ requires a follow-up lab visit  _______ requires a follow-up visit with your provider   Comments:       _________________________________________________________ If you have any questions, please contact our office                     Sincerely,  [name] PA-C [facility]

## 2010-09-09 NOTE — Progress Notes (Signed)
Summary: Anemia update  Phone Note Outgoing Call   Summary of Call: Tell patient that extensive w/u for anemia is fairly normal. Her hemoglobin is just slightly low (should be 12 or higher and hers is 11.3). Would continue the Iron. Make sure she gets in to see GI for screening colonoscopy. Initial call taken by: [name] PA-C,  November 21, 2009 10:41 AM  Follow-up for Phone Call        Left message on answering machine for pt to call back...Marland KitchenMarland KitchenArmenia Shannon  November 27, 2009 8:30 AM  Left message on answering machine for pt to call back.Marland KitchenMarland KitchenMarland KitchenArmenia Shannon  November 27, 2009 4:51 PM   Additional Follow-up for Phone Call Additional follow up Details #1::        spoke with pt and she is aware... [place] Shannon  November 27, 2009 4:56 PM

## 2010-09-09 NOTE — Assessment & Plan Note (Signed)
Summary: Left Wrist Pain   Vital Signs:  Patient profile:   50 year old female Menstrual status:  last cycle 2006 novasure surgery Height:      64.75 inches Weight:      177.6 pounds BMI:     29.89 Temp:     97.7 degrees F oral Pulse rate:   78 / minute Pulse rhythm:   regular Resp:     20 per minute BP sitting:   141 / 76  (left arm)  Vitals Entered By: CMA Student Linzie Collin CC: F/U for  left hand, patient states hands feels alot better, patient wants flu shot Is Patient Diabetic? No Pain Assessment Patient in pain? no       Does patient need assistance? Functional Status Self care Ambulation Normal   Primary Care Provider:  [name] PA-C  CC:  F/U for  left hand, patient states hands feels alot better, and patient wants flu shot.  History of Present Illness: Here for f/u on hand.  Prob 20% better.  She is wearing the thumb spica splint most of the time.  Not wearing today.  She is using the nabumetone.  It is not bothering her stomach.  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  She did not have any evidence of fractures.  She is having difficulty doing her job.  She packs boxes of books.  She has been wearing her brace at work.  It seems to help some.  However, her symptoms are still limiting her duties.   Current Medications (verified): 1)  Vicodin .Marland Kitchen.. 1-2 Tablets 1-2 X Per Month From Dr.poole 2)  Ventolin Hfa 108 (90 Base) Mcg/act  Aers (Albuterol Sulfate) .... Take 2 Puffs Every 4-6 Hours As Needed 3)  Omeprazole 20 Mg Cpdr (Omeprazole) .... Take 1 Tablet By Mouth Each Day 4)  Advair Diskus 250-50 Mcg/dose Misc (Fluticasone-Salmeterol) .Marland Kitchen.. 1 Inhalation Two Times A Day 5)  Ferrous Sulfate 325 (65 Fe) Mg Tabs (Ferrous Sulfate) .... Take 1 Tablet By Mouth Every 12 Hours X 3 Months 6)  Claritin 10 Mg Tabs (Loratadine) .... Take 1 Tablet By Mouth Once A Day As Needed For Allergies 7)  Aleve 220 Mg Tabs (Naproxen  Sodium) .... Two Tablets Daily For Three Days 8)  Lidocaine Viscous 2 % Soln (Lidocaine Hcl) .Marland Kitchen.. 1 Teaspoon Three Times A Day As Needed For Sore Throat 9)  Amoxicillin 500 Mg Caps (Amoxicillin) .... One Capsule By Mouth Three Times A Day For Infection 10)  Nabumetone 500 Mg Tabs (Nabumetone) .Marland Kitchen.. 1-2 Tabs By Mouth Two Times A Day As Needed  Allergies (verified): 1)  ! Ultram  Physical Exam  General:  alert.   Head:  normocephalic and atraumatic.   Msk:  right hand and wrist: no deformity no eyrthema no swelling  Finkelstein negative + pain with active resistance to forced flexion + pain with tenderness of carpal bones (thumb side) no snuff box tend  Neurologic:  alert & oriented X3 and cranial nerves II-XII intact.   grip strength reduced on left  Psych:  normally interactive.     Impression & Recommendations:  Problem # 1:  WRIST PAIN, LEFT (ICD-719.43)  d/w pt referring to OT vs SM clinic she prefers to go to the [facility] clnic will refer continue brace for now  Orders: Sports Medicine (Sports Med)  Complete Medication List: 1)  Vicodin  .Marland Kitchen.. 1-2 tablets 1-2 x per month from  dr.poole 2)  Ventolin Hfa 108 (90 Base) Mcg/act Aers (Albuterol sulfate) .... Take 2 puffs every 4-6 hours as needed 3)  Omeprazole 20 Mg Cpdr (Omeprazole) .... Take 1 tablet by mouth each day 4)  Advair Diskus 250-50 Mcg/dose Misc (Fluticasone-salmeterol) .Marland Kitchen.. 1 inhalation two times a day 5)  Ferrous Sulfate 325 (65 Fe) Mg Tabs (Ferrous sulfate) .... Take 1 tablet by mouth every 12 hours x 3 months 6)  Claritin 10 Mg Tabs (Loratadine) .... Take 1 tablet by mouth once a day as needed for allergies 7)  Aleve 220 Mg Tabs (Naproxen sodium) .... Two tablets daily for three days 8)  Lidocaine Viscous 2 % Soln (Lidocaine hcl) .Marland Kitchen.. 1 teaspoon three times a day as needed for sore throat 9)  Amoxicillin 500 Mg Caps (Amoxicillin) .... One capsule by mouth three times a day for infection 10)  Nabumetone 500 Mg  Tabs (Nabumetone) .Marland Kitchen.. 1-2 tabs by mouth two times a day as needed  Other Orders: Flu Vaccine 4yrs + [phone]) Admin 1st Vaccine (52841) Admin 1st Vaccine [facility]) [phone])  Patient Instructions: 1)  Continue wearing your brace. 2)  Someone will call you to arrange [facility].   Influenza Vaccine    Vaccine Type: Fluvax 3+    Site: left deltoid    Mfr: Gl[employer]oSmithK[employer]    Dose: 0.5 ml    Route: IM    Given by: CMA Student Kenyatta Jeffries    Exp. Date: 02/07/2011    Lot #: [id number]    VIS given: 03/04/10 version given May 16, 2010.  Flu Vaccine Consent Questions    Do you have a history of severe allergic reactions to this vaccine? no    Any prior history of allergic reactions to egg and/or gelatin? no    Do you have a sensitivity to the preservative Thimersol? no    Do you have a past history of Guillan-Barre Syndrome? no    Do you currently have an acute febrile illness? no    Have you ever had a severe reaction to latex? no    Vaccine information given and explained to patient? yes    Are you currently pregnant? no

## 2010-09-09 NOTE — Letter (Signed)
Summary: *HSN Results Follow up  [facility]  [address] [place], [place] [address]   Phone: [phone]  Fax: [phone]      11/20/2009   Teresa Mcmahon [address] [place], [place]  [address]   Dear  Teresa Mcmahon,                            ____S.Drinkard,FNP   ____D. Gore,FNP       ____B. McPherson,MD   ____V. Rankins,MD    ____E. Mulberry,MD    ____N. Daphine Deutscher, FNP  ____D. Reche Dixon, MD    ____K. Philipp Deputy, MD    ____Other     This letter is to inform you that your recent test(s):  _______Pap Smear    _______Lab Test     _______X-ray   ___X____Ultrasound       ____X__Mammogram    _____X__ is within acceptable limits  _______ requires a medication change  _______ requires a follow-up lab visit  _______ requires a follow-up visit with your provider   Comments:       _________________________________________________________ If you have any questions, please contact our office                     Sincerely,  [place] Shannon [facility]

## 2010-09-09 NOTE — Assessment & Plan Note (Signed)
Summary: LEFT WRIST PAIN/NP/LP   Vital Signs:  Patient profile:   50 year old female Menstrual status:  last cycle 2006 novasure surgery Pulse rate:   76 / minute BP sitting:   133 / 83  (left arm)  Vitals Entered By: Rochele Pages RN (June 03, 2010 2:16 PM) CC: L wrist pain - medial and radiates to 3-4 fingers x1 month   Primary Provider:  [name] PA-C  CC:  L wrist pain - medial and radiates to 3-4 fingers x1 month.  History of Present Illness: 50yo R-hand dominant female with L wrist pain x 65-month referred by [name], PA-C. No injury/trauma. Pain on dorsal aspect of wrist & radiating down into 3rd & 4th fingers.  Some occasional numbness/tingling in this same distribution. Mild amount of swelling over dorsal aspect of wrist, no erythema, echymosis, or increased warmth. Using thumb spica splint x 2-3 weeks which does seem to help. Was also taking relefan two times a day for about a week which helped, but has not taking any anti-inflammatories for about 10-days.  Alleve & an OTC muscle rub have been ineffective. Has Hx of carpal tunnel syndrome b/l, but current symptoms are different then her carpal tunnel symptoms. Works at [employer] about 25-30 lbs regularly.  Has been able to continue working with the brace. PCP obtained x-rays 05/12/10 - 4-view wrist (AP/Lat/Oblique/Scaphoid view) & 3-view hand (AP/Lat/Oblique) which showed only mild MCP DJD of thumb, otherwise no other abnormalities.   Overall pain feels approx 20% better from when it started   Allergies: 1)  ! Ultram  Past History:  Past Medical History: Last updated: 10/24/2008 Current Problems:  ASTHMA (ICD-493.90) HEADACHE, TENSION (ICD-307.81) GASTROESOPHAGEAL REFLUX DISEASE (ICD-530.81) ANXIETY DEPRESSION (ICD-300.4) GLAUCOMA age 58  Past Surgical History: Last updated: 10/24/2008 s/p endometrial ablation 2005 s/p neck surgeries x 2....06/1995,07/2003....Dr.Nataleah Scioneaux then Dr.Poole.  Family  History: Last updated: 11/22/09 Mother died age 30 at ALS Father with Blind/Glaucoma,HTN,DM,ASCAD, MI (passed away at 55) 17 siblings....problems include...1 lung cancer,1 heart disease,5 siblings with LUPUS,DM,HTN,cholesterol,ASCAD.   No breast/uterine/ovarian/cervical/or colon cancer.  Social History: Last updated: 11/22/09 Occupation:Graphics designer Married in 01/28/2007 . . . separated (not sexually active) Never Smoked Alcohol use-yes...occasional beer on weekend (denies 11/2009) Drug use-yes....distant MJ in 20's  Review of Systems      See HPI  Physical Exam  General:  Well-developed,well-nourished,in no acute distress; alert,appropriate and cooperative throughout examination Lungs:  normal respiratory effort.   Msk:  WRIST/HAND: - L wrist with mild amount of dorsal swelling extending from forearm into hand.   No bruising, erythema, or increased warmth.  Decreased wrist ROM  in all planes due to pain.   TTP along extensor tendons of the fingers, most prominently along 3rd & 4th digits.  No TTP along volar aspect of wrist/forearm. Minimal TTP along 1st MCP joint & along APL & EPB of the thumb.   Mildly (+)finkelsteins.   Midly decreased grip strength due to pain. Neg Phalen's & neg Tinel's at the wrist & elbow.  - R wrist with full ROM without pain, swelling, laxity, tenderness. Pulses:  +2/4 radial bilaterally Neurologic:  sensation intact to light touch.     Impression & Recommendations:  Problem # 1:  TENOSYNOVITIS, WRIST (ICD-727.05) - Tenosynovitis of extensor compartment of left wrist predominantly along extensor digitorum, possible mild DeQuervain's tenosynovitis - Reviewed x-rays from 05/12/10 showing only mild 1st CMC degnerative changes. - Refill for Relefan given to help with current inflammation. - Recommended limiting  repetitive wrist motions at work as much as possible, offered note for work restrictions, but pt states able to perform ok without  formal restrictions. - Cont. to wear thumb spica brace at work, may sleep in simple volar wrist braceif this is more comfortable. - Emphasized best thing to help this improve is resting that wrist. - f/u in 3-4 weeks if no improvement.  Could consider hand therapy in the future if needed. - Encouraged to call with any questions or concerns.  Complete Medication List: 1)  Vicodin  .Marland Kitchen.. 1-2 tablets 1-2 x per month from dr.poole 2)  Ventolin Hfa 108 (90 Base) Mcg/act Aers (Albuterol sulfate) .... Take 2 puffs every 4-6 hours as needed 3)  Omeprazole 20 Mg Cpdr (Omeprazole) .... Take 1 tablet by mouth each day 4)  Advair Diskus 250-50 Mcg/dose Misc (Fluticasone-salmeterol) .Marland Kitchen.. 1 inhalation two times a day 5)  Ferrous Sulfate 325 (65 Fe) Mg Tabs (Ferrous sulfate) .... Take 1 tablet by mouth every 12 hours x 3 months 6)  Claritin 10 Mg Tabs (Loratadine) .... Take 1 tablet by mouth once a day as needed for allergies 7)  Lidocaine Viscous 2 % Soln (Lidocaine hcl) .Marland Kitchen.. 1 teaspoon three times a day as needed for sore throat 8)  Amoxicillin 500 Mg Caps (Amoxicillin) .... One capsule by mouth three times a day for infection 9)  Nabumetone 500 Mg Tabs (Nabumetone) .Marland Kitchen.. 1-2 tabs by mouth two times a day with food prn  Patient Instructions: 1)  You have tenosynovitis on your left wrist. 2)  Start taking nabumetome 1-2 tabs twice daily with food - take regular for the next 10-14 days, then use as needed. 3)  Cont. to wear your thumb spica brace at work & with activity. 4)  Wear your other splint while sleeping. 5)  Do some simple range of motion exercises at end of the day. 6)  Try to avoid repetitive, heavy lifting as much as possible while at work. 7)  Follow-up in 3-4 weeks if symptoms are persisting. 8)  Call with any questions or concerns. Prescriptions: NABUMETONE 500 MG TABS (NABUMETONE) 1-2 tabs by mouth two times a day with food prn  #120 x 1   Entered and Authorized by:   Darene Lamer MD    Signed by:   Darene Lamer MD on 06/03/2010   Method used:   Electronically to        [facility] (retail)       [address]       [place], [place]  [id number]       Ph: [phone]       Fax: [phone]   RxID:   [id number]    Orders Added: 1)  Consultation Level II [66063]

## 2010-09-09 NOTE — Progress Notes (Signed)
Summary: Office Visit//DEPRESSION SCREENING   Office Visit//DEPRESSION SCREENING   Imported By: Arta Bruce 01/14/2010 11:03:04  _____________________________________________________________________  External Attachment:    Type:   Image     Comment:   External Document

## 2010-09-09 NOTE — Progress Notes (Signed)
Summary: sinus/asthma issues  Phone Note Call from Patient   Summary of Call: PT c/o of sinus/asthma issues since Sat.  She has been using her medications, but they do not seem to be helping.  Still with lots of stuffiness and yellow drainage.  Pt can be reached at [phone] wrk # and the will page her or her cell at [phone]. Initial call taken by: Vesta Mixer CMA,  April 01, 2010 10:32 AM  Follow-up for Phone Call        Left message on voicemail for pt. to return call.  Dutch Quint RN  April 01, 2010 2:14 PM  States she's having trouble breathing, having chest pressure.  Coughing, bringing up thick yellow phlegm, slight sinus congestion but primarily cough.  Voice is hoarse.  Not sure if she's having fevers, having large amount of diaphoresis. Denies nausea or vomiting, states loss of appetite.  Taking claritin and theraflu severe cough.  States use of ventolin loosens mucus, but seems to make cough worse. Follow-up by: Dutch Quint RN,  April 01, 2010 2:40 PM  Additional Follow-up for Phone Call Additional follow up Details #1::        She needs acute appointment tomorrow or go to urgent care tonight. [name] PA-C  April 01, 2010 4:55 PM  Advised of above; no appts. available tomorrow at this time -- if able, will wait until tomorrow morning and call to check for cancellations. Advised of hours of urgent care if she chooses to go there.  Dutch Quint RN  April 01, 2010 5:09 PM     Additional Follow-up for Phone Call Additional follow up Details #2::    Called this morning, did not go to urgent care.  Has appt. here this AM.  Dutch Quint RN  April 02, 2010 9:15 AM  Pt. in office.  Dutch Quint RN  April 02, 2010 10:21 AM

## 2010-09-09 NOTE — Progress Notes (Signed)
Summary: PLEASE CALL PT NEED APP SOONER  Phone Note Call from Patient   Summary of Call: PLEASE CALL PATIENT SHE NEEDS APP SOONER BUT WE DON'T HAVE AVALIBLE NOT TILL 08-06-10 I TRY TO SHEDULE THE  APP BUT SHE GOT MAD. SHE ASK FOR MY NAME AND SUPERVISIOR NAME. Initial call taken by: Domenic Polite,  June 24, 2010 9:51 AM  Follow-up for Phone Call        Left message on answering machine for pt. to return call.  Dutch Quint RN  June 25, 2010 10:05 AM   Additional Follow-up for Phone Call Additional follow up Details #1::        [name] RETURNED YOUR CALL, BUT YOU WERE W/A PATIENT. SHE SAYS TO LET YOU KNOW THAT HER COUGH & COLD IS NO BETTER AND SHE GETS OFF @ 3:30. Additional Follow-up by: Leodis Rains,  June 25, 2010 12:07 PM    Additional Follow-up for Phone Call Additional follow up Details #2::    Left message on answering machine for pt. to return call.  Dutch Quint RN  June 25, 2010 4:13 PM  Showed up in office.  States symptoms recurred when she put her heat on in her house -- has constant tickle to her throat.  Has nasal drainage, yellowish in color, occasional productive cough, also yellowish mucus.  States she has a constant dry, irritating cough.  Has taken several OTC meds, has not yet humidified her home or tried Robitussin or Mucinex.  Is currently afebrile - temp 97.6, states she had a fever of 101 last Tuesday or Wednesday; that she feels well except for constant cough.  Instructed as per cough/cold protocol for home management.  To call back if symptoms persist or worsen after home management remedies.  Follow-up by: Dutch Quint RN,  June 25, 2010 5:00 PM

## 2010-09-09 NOTE — Progress Notes (Signed)
Summary: Sports Medicine Clinic Referral  Phone Note Outgoing Call   Summary of Call: Please refer to Sports Medicine Clinic at [facility] for left wrist pain.  Initial call taken by: [name] PA-C,  May 16, 2010 12:59 PM  Follow-up for Phone Call        done. Follow-up by: Gaylyn Cheers RN,  May 21, 2010 4:23 PM

## 2010-09-09 NOTE — Progress Notes (Signed)
Summary: GI referral  Phone Note Outgoing Call   Summary of Call: Debra, GI referral in system for eval for screening colonoscopy. Thanks  Initial call taken by: Brynda Rim,  November 11, 2009 12:24 PM

## 2010-09-09 NOTE — Progress Notes (Signed)
  Phone Note Outgoing Call   Summary of Call: Patient sched at 2pm on Friday.  Needs to be rescheduled.  This is my admin. time. Initial call taken by: Brynda Rim,  April 23, 2010 10:12 PM  Follow-up for Phone Call        Teresa Mcmahon is working on it Follow-up by: [place] Shannon,  April 24, 2010 2:00 PM

## 2010-09-25 NOTE — Letter (Signed)
Summary: PSYCHOLOGY SUMMARY  PSYCHOLOGY SUMMARY   Imported By: Arta Bruce 09/16/2010 09:45:46  _____________________________________________________________________  External Attachment:    Type:   Image     Comment:   External Document

## 2010-11-03 ENCOUNTER — Other Ambulatory Visit (HOSPITAL_COMMUNITY): Payer: Self-pay | Admitting: Internal Medicine

## 2010-11-03 DIAGNOSIS — Z1231 Encounter for screening mammogram for malignant neoplasm of breast: Secondary | ICD-10-CM

## 2010-11-03 LAB — CBC
HCT: 32 % — ABNORMAL LOW (ref 36.0–46.0)
Hemoglobin: 9.9 g/dL — ABNORMAL LOW (ref 12.0–15.0)
Platelets: 217 10*3/uL (ref 150–400)
RBC: 4.36 MIL/uL (ref 3.87–5.11)
WBC: 5.5 10*3/uL (ref 4.0–10.5)

## 2010-11-03 LAB — POCT I-STAT, CHEM 8
Calcium, Ion: 1.23 mmol/L (ref 1.12–1.32)
Chloride: 108 mEq/L (ref 96–112)
Creatinine, Ser: 1.2 mg/dL (ref 0.4–1.2)
Hemoglobin: 11.6 g/dL — ABNORMAL LOW (ref 12.0–15.0)
Sodium: 140 mEq/L (ref 135–145)
TCO2: 26 mmol/L (ref 0–100)

## 2010-11-03 LAB — POCT CARDIAC MARKERS
CKMB, poc: <1 ng/mL — ABNORMAL LOW (ref 1.0–8.0)
Myoglobin, poc: 50.6 ng/mL (ref 12–200)

## 2010-11-03 LAB — DIFFERENTIAL
Lymphocytes Relative: 30 % (ref 12–46)
Monocytes Relative: 8 % (ref 3–12)
Neutrophils Relative %: 60 % (ref 43–77)

## 2010-11-28 ENCOUNTER — Ambulatory Visit (HOSPITAL_COMMUNITY): Payer: Self-pay

## 2010-12-05 ENCOUNTER — Ambulatory Visit (HOSPITAL_COMMUNITY)
Admission: RE | Admit: 2010-12-05 | Discharge: 2010-12-05 | Disposition: A | Discharge status: Home / Self Care | Payer: Self-pay | Source: Ambulatory Visit | Attending: Internal Medicine | Admitting: Internal Medicine

## 2010-12-05 DIAGNOSIS — Z1231 Encounter for screening mammogram for malignant neoplasm of breast: Secondary | ICD-10-CM | POA: Insufficient documentation

## 2010-12-26 NOTE — Op Note (Signed)
NAME:  Teresa Mcmahon, Teresa Mcmahon                       ACCOUNT NO.:  [id number]   MEDICAL RECORD NO.:  [id number]                   PATIENT TYPE:  AMB   LOCATION:  SDC                                  FACILITY:  [facility]   PHYSICIAN:  Kathreen Cosier, M.D.           DATE OF BIRTH:  1961-04-24   DATE OF PROCEDURE:  02/20/2004  DATE OF DISCHARGE:                                 OPERATIVE REPORT   Under general anesthesia, the patient in lithotomy position, perineum and  vagina prepped and draped.  Bladder emptied with straight catheter.  Bimanual examination revealed uterus top normal size.  Speculum was placed  in the vagina.  Anterior lip of the cervix grasped with tenaculum.  The  cervix curetted, a small amount of tissue obtained.  Endometrial cavity  sounded to 9 cm.  Then cervical length was measured at 4 cm, giving a cavity  length of 5 cm.  The cervix dilated to #27 Shawnie Pons and diagnostic hysteroscopy  done.  The cavity was normal.  The hysteroscopy bump was 70 mmHg.  A sharp  curettage was done.  A small amount of tissue obtained.  The NovaSure device  was inserted and seated and the cavity integrity established.  The width of  the cavity was 4 cm and the power was at 10 watts and ablation complete  after 53 seconds.  Hysteroscopy was repeated and there was total ablation of  the cavity.  The patient tolerated the procedure well.  The fluid deficit  was noted at 70 mL.  The patient was taken to the recovery room in good  condition.                                               Kathreen Cosier, M.D.    BAM/MEDQ  D:  02/20/2004  T:  02/20/2004  Job:  [id number]

## 2010-12-26 NOTE — Op Note (Signed)
NAME:  Teresa Mcmahon, Teresa Mcmahon                       ACCOUNT NO.:  [id number]   MEDICAL RECORD NO.:  [id number]                   PATIENT TYPE:  OIB   LOCATION:  3172                                 FACILITY:  [facility]   PHYSICIAN:  Kathaleen Maser. Pool, M.D.                 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   PROCEDURE:  C3-4 and C4-5 anterior cervical diskectomy and fusion, allograft  anterior plating.   SURGEON:  Kathaleen Maser. Pool, M.D.   ASSISTANT:  Donalee Citrin, M.D.   ANESTHESIA:  General.   INDICATIONS FOR PROCEDURE:  Ms. Marylyn Ishihara is a 50 year old female who is status  post previous C5-6 anterior cervical diskectomy and fusion with allograft  done in 1996. She presents now with worsening neck and bilateral upper  extremities symptoms, consistent with an early cervical myelopathy. Work-up  demonstrated evidence of stenosis at C3-4 and to a lesser degree C4-5  levels, with some early spinal cord compression.  The patient has failed  conservative management. She presents now for 2 level anterior decompression  and fusion with instrumentation.   DESCRIPTION OF PROCEDURE:  The patient was taken to the operating room, the  patient was placed on the table in the  supine position, where she underwent  local anesthesia.  Patient was positioned supine with her neck slightly  extended and held in place with Halter traction.  The patient's anterior  cervical region was prepped and draped sterilely.   A 10 cm linear skin incision overlying the C4 inter-level was made. This was  carried down sharply to the platysma. Platysma was then divided vertically  and dissection proceeded along the medial border of the sternocleidomastoid  muscle and carotid sheath.  Trachea and esophagus  were mobilized towards the  left.  Prevertebral fascia was stripped off the anterior spinal column.  The  longus colli muscles were then elevated bilaterally using the  electrocautery.  Deep, self-retaining retractor was placed.  Intraoperative  fluoroscopy was used and the C3-4 and C4-5 levels were confirmed.  Disk  spaces at both levels were then incised with the #15 blade in a rectangular  fashion.  Wide disk space cleanout was then achieved using pituitary  rongeurs, forward and backward, Carlens curets, Kerrison rongeurs and a high  speed drill.  All elements of the disk were removed down to the level of the  posterior annulus.   Microscope was brought into the field and used throughout the remainder of  the diskectomy. Remaining aspects of the annulus and osteophytes were  removed using high speed drill, down to the posterior longitudinal ligament.  The posterior longitudinal ligament was then elevated and resected in a  piecemeal fashion, starting first  at C3-4 level. The underlying thecal sac  was identified, and a wide, central decompression was then performed by  undercutting the body of the C3 and C4 using Kerrison rongeurs.  Decompression proceeded into each neural foramina.  The C4 nerve roots were  identified proximally and found to be free of any further compression.  The  posterior longitudinal ligament was then elevated and resected piecemeal  fashion using Kerrison rongeurs.  Underlying thecal sac was identified.  Wide, central decompression was then performed by undercutting the bodies of  C4 and C5.  Decompression then proceeded into each internal foramina.  There  was no evidence of any residual stenosis.   The wound was irrigated. Gelfoam was placed topically and hemostasis was  found to be good.  A 7 mm patellar wedge allograft was then impacted into  place, recessed approximately 1 mm from the anterior cortical margin at C4-  5. A 6 mm patellar wedge allograft was  then impacted into place at C3-4.  A  40 mm, Atlantis anterior cervical plate was then placed over the C3, C4 and  C5 levels. This was then attached under fluoroscopic guidance using 13 mm  variable bone screws, 2 each at all 3 levels.  All 6 screws had solid  purchase.  Locking screws were engaged at all 3 levels. Final images  revealed good position of the bone grafts, hardware, proper operative leve  and normal alignment of the spine.   The wound was then irrigated in antibiotics solution. Gelfoam was placed  topically.  Hemostasis was found to be good. The wound was then closed in  typical fashion. Steri-Strips, sterile dressings were applied. There were no  apparent complications. The patient tolerated the procedure well and she  returned to the recovery room.                                               Henry A. Pool, M.D.    HAP/MEDQ  D:  07/23/2003  T:  07/23/2003  Job:  [id number]

## 2011-01-07 ENCOUNTER — Emergency Department (HOSPITAL_COMMUNITY)
Admission: EM | Admit: 2011-01-07 | Discharge: 2011-01-07 | Disposition: A | Discharge status: Home / Self Care | Payer: Self-pay | Attending: Emergency Medicine | Admitting: Emergency Medicine

## 2011-01-07 ENCOUNTER — Emergency Department (HOSPITAL_COMMUNITY): Payer: Self-pay

## 2011-01-07 DIAGNOSIS — Z79899 Other long term (current) drug therapy: Secondary | ICD-10-CM | POA: Insufficient documentation

## 2011-01-07 DIAGNOSIS — M25519 Pain in unspecified shoulder: Secondary | ICD-10-CM | POA: Insufficient documentation

## 2011-01-07 DIAGNOSIS — M542 Cervicalgia: Secondary | ICD-10-CM | POA: Insufficient documentation

## 2011-01-07 DIAGNOSIS — Z981 Arthrodesis status: Secondary | ICD-10-CM | POA: Insufficient documentation

## 2011-01-07 DIAGNOSIS — J45909 Unspecified asthma, uncomplicated: Secondary | ICD-10-CM | POA: Insufficient documentation

## 2011-07-20 ENCOUNTER — Other Ambulatory Visit: Payer: Self-pay | Admitting: Gastroenterology

## 2011-07-20 ENCOUNTER — Encounter (HOSPITAL_COMMUNITY)
Admission: RE | Disposition: A | Discharge status: Home / Self Care | Payer: Self-pay | Source: Ambulatory Visit | Attending: Gastroenterology

## 2011-07-20 ENCOUNTER — Ambulatory Visit (HOSPITAL_COMMUNITY)
Admission: RE | Admit: 2011-07-20 | Discharge: 2011-07-20 | Disposition: A | Discharge status: Home / Self Care | Payer: Self-pay | Source: Ambulatory Visit | Attending: Gastroenterology | Admitting: Gastroenterology

## 2011-07-20 ENCOUNTER — Encounter (HOSPITAL_COMMUNITY): Payer: Self-pay | Admitting: *Deleted

## 2011-07-20 DIAGNOSIS — D126 Benign neoplasm of colon, unspecified: Secondary | ICD-10-CM | POA: Insufficient documentation

## 2011-07-20 HISTORY — DX: Anemia, unspecified: D64.9

## 2011-07-20 HISTORY — DX: Carpal tunnel syndrome, unspecified upper limb: G56.00

## 2011-07-20 HISTORY — DX: Gastro-esophageal reflux disease without esophagitis: K21.9

## 2011-07-20 HISTORY — DX: Shortness of breath: R06.02

## 2011-07-20 HISTORY — PX: COLONOSCOPY: SHX5424

## 2011-07-20 SURGERY — COLONOSCOPY
Anesthesia: Moderate Sedation

## 2011-07-20 MED ORDER — MIDAZOLAM HCL 10 MG/2ML IJ SOLN
INTRAMUSCULAR | Status: AC
  Filled 2011-07-20: qty 4

## 2011-07-20 MED ORDER — MIDAZOLAM HCL 10 MG/2ML IJ SOLN
INTRAMUSCULAR | Status: DC | PRN
  Administered 2011-07-20 (×4): 2.5 mg via INTRAVENOUS

## 2011-07-20 MED ORDER — SODIUM CHLORIDE 0.9 % IV SOLN
Freq: Once | INTRAVENOUS | Status: AC
  Administered 2011-07-20: 500 mL via INTRAVENOUS

## 2011-07-20 MED ORDER — FENTANYL CITRATE 0.05 MG/ML IJ SOLN
INTRAMUSCULAR | Status: DC | PRN
  Administered 2011-07-20 (×4): 25 ug via INTRAVENOUS

## 2011-07-20 MED ORDER — FENTANYL CITRATE 0.05 MG/ML IJ SOLN
INTRAMUSCULAR | Status: AC
  Filled 2011-07-20: qty 4

## 2011-07-20 NOTE — H&P (Signed)
Patient seen and examined in preop please see scanned H&P for details

## 2011-07-20 NOTE — Op Note (Signed)
[facility] [address] [place], [place]  [address]  COLONOSCOPY PROCEDURE REPORT  PATIENT:  Man, Bonneau  MR#:  [id number] BIRTHDATE:  02/27/1961, 50 yrs. old  GENDER:  female ENDOSCOPIST:  Vida Rigger, MD REF. BY: PROCEDURE DATE:  07/20/2011 PROCEDURE:  Colonoscopy with snare polypectomy, Colonoscopy with hot biopsy and snare polypectomy ASA CLASS:  Class I INDICATIONS:  Anemia, Screening, family Hx of polyps MEDICATIONS:   Fentanyl 100 mcg IV, Versed 10 mg IV  DESCRIPTION OF PROCEDURE:   After the risks benefits and alternatives of the procedure were thoroughly explained, informed consent was obtained.  The Pentax Ped Colon [id number] endoscope was introduced through the anus and advanced to the ileum, without limitations.  The quality of the prep was adequate..  The instrument was then slowly withdrawn as the colon was fully examined. <<PROCEDUREIMAGES>>  FINDINGS:1- i/e hemorrhoid-small 2- 2 smal transverspolyps 1 snared and the oter snared and hot bx the edge  3- otherwise wnl to ti without blood COMPLICATIONS:none  IMPRESSION: above  RECOMMENDATIONS: await path to determine future scr --f/u prn  ______________________________ Vida Rigger, MD  CC:  n. eSIGNEDVida Rigger at 07/20/2011 09:53 AM  Nelia Shi, [phone]

## 2011-07-21 ENCOUNTER — Encounter (HOSPITAL_COMMUNITY): Payer: Self-pay | Admitting: Gastroenterology

## 2011-10-14 ENCOUNTER — Other Ambulatory Visit: Payer: Self-pay

## 2011-10-14 ENCOUNTER — Emergency Department (HOSPITAL_COMMUNITY)
Admission: EM | Admit: 2011-10-14 | Discharge: 2011-10-14 | Disposition: A | Discharge status: Home / Self Care | Payer: Self-pay | Attending: Emergency Medicine | Admitting: Emergency Medicine

## 2011-10-14 ENCOUNTER — Encounter (HOSPITAL_COMMUNITY): Payer: Self-pay

## 2011-10-14 DIAGNOSIS — R42 Dizziness and giddiness: Secondary | ICD-10-CM | POA: Insufficient documentation

## 2011-10-14 DIAGNOSIS — K219 Gastro-esophageal reflux disease without esophagitis: Secondary | ICD-10-CM | POA: Insufficient documentation

## 2011-10-14 DIAGNOSIS — R51 Headache: Secondary | ICD-10-CM | POA: Insufficient documentation

## 2011-10-14 DIAGNOSIS — R1110 Vomiting, unspecified: Secondary | ICD-10-CM | POA: Insufficient documentation

## 2011-10-14 DIAGNOSIS — J45909 Unspecified asthma, uncomplicated: Secondary | ICD-10-CM | POA: Insufficient documentation

## 2011-10-14 DIAGNOSIS — R5381 Other malaise: Secondary | ICD-10-CM | POA: Insufficient documentation

## 2011-10-14 LAB — URINALYSIS, ROUTINE W REFLEX MICROSCOPIC
Bilirubin Urine: NEGATIVE
Glucose, UA: NEGATIVE mg/dL
Ketones, ur: NEGATIVE mg/dL
Protein, ur: NEGATIVE mg/dL
Specific Gravity, Urine: 1.02 (ref 1.005–1.030)
Urobilinogen, UA: 0.2 mg/dL (ref 0.0–1.0)
pH: 6 (ref 5.0–8.0)

## 2011-10-14 MED ORDER — MECLIZINE HCL 25 MG PO TABS
50.0000 mg | ORAL_TABLET | Freq: Once | ORAL | Status: AC
  Administered 2011-10-14: 50 mg via ORAL
  Filled 2011-10-14: qty 1

## 2011-10-14 MED ORDER — MECLIZINE HCL 25 MG PO TABS
ORAL_TABLET | ORAL | Status: AC
  Filled 2011-10-14: qty 1

## 2011-10-14 MED ORDER — ONDANSETRON HCL 4 MG PO TABS: 4.0000 mg | ORAL_TABLET | Freq: Four times a day (QID) | ORAL | Status: AC

## 2011-10-14 MED ORDER — MECLIZINE HCL 25 MG PO TABS: 25.0000 mg | ORAL_TABLET | Freq: Four times a day (QID) | ORAL | Status: AC

## 2011-10-14 MED ORDER — ONDANSETRON 8 MG PO TBDP
8.0000 mg | ORAL_TABLET | Freq: Once | ORAL | Status: AC
  Administered 2011-10-14: 8 mg via ORAL
  Filled 2011-10-14: qty 1

## 2011-10-14 NOTE — Discharge Instructions (Signed)
FOLLOW UP WITH YOUR DOCTOR FOR RECHECK THIS WEEK. TAKE MEDICATIONS AS PRESCRIBED AND RETURN HERE WITH ANY WORSENING SYMPTOMS OR NEW CONCERNS.   Benign Positional Vertigo Vertigo means you feel like you or your surroundings are moving when they are not. Benign positional vertigo is the most common form of vertigo. Benign means that the cause of your condition is not serious. Benign positional vertigo is more common in older adults. CAUSES  Benign positional vertigo is the result of an upset in the labyrinth system. This is an area in the middle ear that helps control your balance. This may be caused by a viral infection, head injury, or repetitive motion. However, often no specific cause is found. SYMPTOMS  Symptoms of benign positional vertigo occur when you move your head or eyes in different directions. Some of the symptoms may include:  Loss of balance and falls.   Vomiting.   Blurred vision.   Dizziness.   Nausea.   Involuntary eye movements (nystagmus).  DIAGNOSIS  Benign positional vertigo is usually diagnosed by physical exam. If the specific cause of your benign positional vertigo is unknown, your caregiver may perform imaging tests, such as magnetic resonance imaging (MRI) or computed tomography (CT). TREATMENT  Your caregiver may recommend movements or procedures to correct the benign positional vertigo. Medicines such as meclizine, benzodiazepines, and medicines for nausea may be used to treat your symptoms. In rare cases, if your symptoms are caused by certain conditions that affect the inner ear, you may need surgery. HOME CARE INSTRUCTIONS   Follow your caregiver's instructions.   Move slowly. Do not make sudden body or head movements.   Avoid driving.   Avoid operating heavy machinery.   Avoid performing any tasks that would be dangerous to you or others during a vertigo episode.   Drink enough fluids to keep your urine clear or pale yellow.  SEEK IMMEDIATE MEDICAL  CARE IF:   You develop problems with walking, weakness, numbness, or using your arms, hands, or legs.   You have difficulty speaking.   You develop severe headaches.   Your nausea or vomiting continues or gets worse.   You develop visual changes.   Your family or friends notice any behavioral changes.   Your condition gets worse.   You have a fever.   You develop a stiff neck or sensitivity to light.  MAKE SURE YOU:   Understand these instructions.   Will watch your condition.   Will get help right away if you are not doing well or get worse.  Document Released: 05/04/2006 Document Revised: 07/16/2011 Document Reviewed: 04/16/2011 [facility] Patient Information 2012 [place], [place].

## 2011-10-14 NOTE — ED Provider Notes (Signed)
History     CSN: [id number]  Arrival date & time 10/14/11  0904   First MD Initiated Contact with Patient 10/14/11 0930      Chief Complaint  Patient presents with  . Dizziness  . Emesis  . Headache    (Consider location/radiation/quality/duration/timing/severity/associated sxs/prior treatment) Patient is a 51 y.o. female presenting with headaches and weakness. The history is provided by the patient.  Headache  Associated symptoms include nausea and vomiting. Pertinent negatives include no syncope.  Weakness The primary symptoms include headaches, dizziness, nausea and vomiting. Primary symptoms do not include syncope, loss of consciousness, visual change, paresthesias, focal weakness or speech change. The symptoms began 2 to 6 hours ago. The symptoms are unchanged. The neurological symptoms are diffuse. Context: No history of vertigo in the past. No recent head injury. She finished an antibiotic regimen for recent insect bite, however, this was two weeks ago.  The headache is associated with weakness. The headache is not associated with visual change or paresthesias.  Dizziness also occurs with nausea, vomiting and weakness. Dizziness does not occur with tinnitus.  Additional symptoms include weakness. Additional symptoms do not include tinnitus. Medical issues do not include diabetes or hypertension.    Past Medical History  Diagnosis Date  . Asthma   . Shortness of breath   . Anemia   . GERD (gastroesophageal reflux disease)   . Carpal tunnel syndrome     Past Surgical History  Procedure Date  . Back surgery   . Abation   . Colonoscopy 07/20/2011    Procedure: COLONOSCOPY;  Surgeon: Petra Kuba, MD;  Location: [facility] ENDOSCOPY;  Service: Endoscopy;  Laterality: N/A;    Family History  Problem Relation Age of Onset  . Anesthesia problems Neg Hx   . Hypotension Neg Hx   . Malignant hyperthermia Neg Hx   . Pseudochol deficiency Neg Hx     History  Substance Use Topics   . Smoking status: Not on file  . Smokeless tobacco: Not on file  . Alcohol Use: 3.6 oz/week    6 Cans of beer per week    OB History    Grav Para Term Preterm Abortions TAB SAB Ect Mult Living                  Review of Systems  HENT: Negative for tinnitus.   Cardiovascular: Negative for syncope.  Gastrointestinal: Positive for nausea and vomiting.  Neurological: Positive for dizziness, weakness and headaches. Negative for speech change, focal weakness, loss of consciousness and paresthesias.    Allergies  Tramadol hcl  Home Medications   Current Outpatient Rx  Name Route Sig Dispense Refill  . ALBUTEROL SULFATE (2.5 MG/3ML) 0.083% IN NEBU Nebulization Take 2.5 mg by nebulization every 6 (six) hours as needed.      Marland Kitchen HYDROCODONE-ACETAMINOPHEN 5-500 MG PO TABS Oral Take 1 tablet by mouth every 6 (six) hours as needed.        BP 156/70  Pulse 75  Temp(Src) 97.8 F (36.6 C) (Oral)  Resp 20  Ht 5\' 7"  (1.702 m)  Wt 182 lb (82.555 kg)  BMI 28.51 kg/m2  SpO2 100%  Physical Exam  Constitutional: She is oriented to person, place, and time. She appears well-developed and well-nourished.  HENT:  Head: Normocephalic.  Eyes: Pupils are equal, round, and reactive to light.  Neck: Normal range of motion. Neck supple.  Cardiovascular: Normal rate and regular rhythm.   Pulmonary/Chest: Effort normal and breath sounds normal.  Abdominal: Soft. Bowel sounds are normal. There is no tenderness. There is no rebound and no guarding.  Musculoskeletal: Normal range of motion.  Neurological: She is alert and oriented to person, place, and time. She has normal strength and normal reflexes. No cranial nerve deficit or sensory deficit. She displays a negative Romberg sign. Coordination normal.  Skin: Skin is warm and dry. No rash noted.  Psychiatric: She has a normal mood and affect.    ED Course  Procedures (including critical care time)  Labs Reviewed - No data to display No results  found.   No diagnosis found.    MDM  Patient feeling better with Meclizine. No vomiting. Symptoms c/w vertigo without neurologic deficit or abnormality on exam. Feel she can be discharged home to follow up with her doctor as needed.        Rodena Medin, PA-C 10/14/11 1108

## 2011-10-14 NOTE — ED Notes (Signed)
Pt presents with no acute distress.  Reports this am awoke with extreme dizziness and nausea- walked to bathroom and vomiting.  Pt states does not feel any better denies diarrhea and fever, neg for stroke- denies CP and weakness at present

## 2011-10-15 NOTE — ED Provider Notes (Signed)
Medical screening examination/treatment/procedure(s) were performed by non-physician practitioner and as supervising physician I was immediately available for consultation/collaboration.  Mykenzie Ebanks, MD 10/15/11 0726 

## 2011-12-10 ENCOUNTER — Other Ambulatory Visit (HOSPITAL_COMMUNITY): Payer: Self-pay | Admitting: Family Medicine

## 2011-12-10 DIAGNOSIS — Z1231 Encounter for screening mammogram for malignant neoplasm of breast: Secondary | ICD-10-CM

## 2012-01-07 ENCOUNTER — Ambulatory Visit (HOSPITAL_COMMUNITY): Payer: Self-pay

## 2012-01-28 ENCOUNTER — Ambulatory Visit (HOSPITAL_COMMUNITY)
Admission: RE | Admit: 2012-01-28 | Discharge: 2012-01-28 | Disposition: A | Discharge status: Home / Self Care | Payer: Self-pay | Source: Ambulatory Visit | Attending: Family Medicine | Admitting: Family Medicine

## 2012-01-28 DIAGNOSIS — Z1231 Encounter for screening mammogram for malignant neoplasm of breast: Secondary | ICD-10-CM | POA: Insufficient documentation

## 2013-11-13 ENCOUNTER — Emergency Department (INDEPENDENT_AMBULATORY_CARE_PROVIDER_SITE_OTHER)
Admission: EM | Admit: 2013-11-13 | Discharge: 2013-11-13 | Disposition: A | Discharge status: Home / Self Care | Payer: Self-pay | Source: Home / Self Care | Attending: Family Medicine | Admitting: Family Medicine

## 2013-11-13 ENCOUNTER — Encounter (HOSPITAL_COMMUNITY): Payer: Self-pay | Admitting: Emergency Medicine

## 2013-11-13 DIAGNOSIS — H8110 Benign paroxysmal vertigo, unspecified ear: Secondary | ICD-10-CM

## 2013-11-13 DIAGNOSIS — J309 Allergic rhinitis, unspecified: Secondary | ICD-10-CM

## 2013-11-13 DIAGNOSIS — IMO0002 Reserved for concepts with insufficient information to code with codable children: Secondary | ICD-10-CM

## 2013-11-13 DIAGNOSIS — R059 Cough, unspecified: Secondary | ICD-10-CM

## 2013-11-13 DIAGNOSIS — R05 Cough: Secondary | ICD-10-CM

## 2013-11-13 DIAGNOSIS — R058 Other specified cough: Secondary | ICD-10-CM

## 2013-11-13 MED ORDER — FLUTICASONE PROPIONATE 50 MCG/ACT NA SUSP: 2.0000 | Freq: Two times a day (BID) | NASAL | Status: DC

## 2013-11-13 MED ORDER — ALBUTEROL SULFATE HFA 108 (90 BASE) MCG/ACT IN AERS
INHALATION_SPRAY | RESPIRATORY_TRACT | Status: AC
  Filled 2013-11-13: qty 6.7

## 2013-11-13 MED ORDER — CHLORPHENIRAMINE-PSE-IBUPROFEN 2-30-200 MG PO TABS: ORAL_TABLET | ORAL | Status: DC

## 2013-11-13 MED ORDER — ALBUTEROL SULFATE HFA 108 (90 BASE) MCG/ACT IN AERS
2.0000 | INHALATION_SPRAY | Freq: Once | RESPIRATORY_TRACT | Status: AC
  Administered 2013-11-13: 2 via RESPIRATORY_TRACT

## 2013-11-13 MED ORDER — PREDNISONE 10 MG PO TABS: ORAL_TABLET | ORAL | Status: DC

## 2013-11-13 MED ORDER — MECLIZINE HCL 25 MG PO TABS: 25.0000 mg | ORAL_TABLET | Freq: Four times a day (QID) | ORAL | Status: DC

## 2013-11-13 NOTE — ED Provider Notes (Signed)
Medical screening examination/treatment/procedure(s) were performed by resident physician or non-physician practitioner and as supervising physician I was immediately available for consultation/collaboration.   Pauline Good MD.   Billy Fischer, MD 11/13/13 2127

## 2013-11-13 NOTE — Discharge Instructions (Signed)
Allergic Rhinitis Allergic rhinitis is when the mucous membranes in the nose respond to allergens. Allergens are particles in the air that cause your body to have an allergic reaction. This causes you to release allergic antibodies. Through a chain of events, these eventually cause you to release histamine into the blood stream. Although meant to protect the body, it is this release of histamine that causes your discomfort, such as frequent sneezing, congestion, and an itchy, runny nose.  CAUSES  Seasonal allergic rhinitis (hay fever) is caused by pollen allergens that may come from grasses, trees, and weeds. Year-round allergic rhinitis (perennial allergic rhinitis) is caused by allergens such as house dust mites, pet dander, and mold spores.  SYMPTOMS   Nasal stuffiness (congestion).  Itchy, runny nose with sneezing and tearing of the eyes. DIAGNOSIS  Your health care provider can help you determine the allergen or allergens that trigger your symptoms. If you and your health care provider are unable to determine the allergen, skin or blood testing may be used. TREATMENT  Allergic Rhinitis does not have a cure, but it can be controlled by:  Medicines and allergy shots (immunotherapy).  Avoiding the allergen. Hay fever may often be treated with antihistamines in pill or nasal spray forms. Antihistamines block the effects of histamine. There are over-the-counter medicines that may help with nasal congestion and swelling around the eyes. Check with your health care provider before taking or giving this medicine.  If avoiding the allergen or the medicine prescribed do not work, there are many new medicines your health care provider can prescribe. Stronger medicine may be used if initial measures are ineffective. Desensitizing injections can be used if medicine and avoidance does not work. Desensitization is when a patient is given ongoing shots until the body becomes less sensitive to the allergen.  Make sure you follow up with your health care provider if problems continue. HOME CARE INSTRUCTIONS It is not possible to completely avoid allergens, but you can reduce your symptoms by taking steps to limit your exposure to them. It helps to know exactly what you are allergic to so that you can avoid your specific triggers. SEEK MEDICAL CARE IF:   You have a fever.  You develop a cough that does not stop easily (persistent).  You have shortness of breath.  You start wheezing.  Symptoms interfere with normal daily activities. Document Released: 04/21/2001 Document Revised: 05/17/2013 Document Reviewed: 04/03/2013 [facility] Patient Information 2014 [place].  Benign Positional Vertigo Vertigo means you feel like you or your surroundings are moving when they are not. Benign positional vertigo is the most common form of vertigo. Benign means that the cause of your condition is not serious. Benign positional vertigo is more common in older adults. CAUSES  Benign positional vertigo is the result of an upset in the labyrinth system. This is an area in the middle ear that helps control your balance. This may be caused by a viral infection, head injury, or repetitive motion. However, often no specific cause is found. SYMPTOMS  Symptoms of benign positional vertigo occur when you move your head or eyes in different directions. Some of the symptoms may include:  Loss of balance and falls.  Vomiting.  Blurred vision.  Dizziness.  Nausea.  Involuntary eye movements (nystagmus). DIAGNOSIS  Benign positional vertigo is usually diagnosed by physical exam. If the specific cause of your benign positional vertigo is unknown, your caregiver may perform imaging tests, such as magnetic resonance imaging (MRI) or computed tomography (  CT). TREATMENT  Your caregiver may recommend movements or procedures to correct the benign positional vertigo. Medicines such as meclizine, benzodiazepines, and  medicines for nausea may be used to treat your symptoms. In rare cases, if your symptoms are caused by certain conditions that affect the inner ear, you may need surgery. HOME CARE INSTRUCTIONS   Follow your caregiver's instructions.  Move slowly. Do not make sudden body or head movements.  Avoid driving.  Avoid operating heavy machinery.  Avoid performing any tasks that would be dangerous to you or others during a vertigo episode.  Drink enough fluids to keep your urine clear or pale yellow. SEEK IMMEDIATE MEDICAL CARE IF:   You develop problems with walking, weakness, numbness, or using your arms, hands, or legs.  You have difficulty speaking.  You develop severe headaches.  Your nausea or vomiting continues or gets worse.  You develop visual changes.  Your family or friends notice any behavioral changes.  Your condition gets worse.  You have a fever.  You develop a stiff neck or sensitivity to light. MAKE SURE YOU:   Understand these instructions.  Will watch your condition.  Will get help right away if you are not doing well or get worse. Document Released: 05/04/2006 Document Revised: 10/19/2011 Document Reviewed: 04/16/2011 [facility] Patient Information 2014 [place].

## 2013-11-13 NOTE — ED Provider Notes (Signed)
CSN: [id number]     Arrival date & time 11/13/13  1901 History   None    Chief Complaint  Patient presents with  . Shoulder Pain  . Chest Pain   (Consider location/radiation/quality/duration/timing/severity/associated sxs/prior Treatment) HPI Comments: 53 year old female presents complaining of feeling of lightheadedness/vertigo for 2 days associated with some mild nausea, and some mild chest tightness. She says that on Friday, she was at an outdoor event and was having severe issues with her allergies. When she woke up on Saturday morning, she had a bad sore throat and started to experience some vertigo. She describes this as feeling like she is sitting upright but also moving to the side. This is made worse with leaning forward or rising to see it from a laying flat position. Yesterday, she started to experience some tightness in her chest. She describes this as pressure in a bandlike distribution across her chest. She specifically says that there is no pain. She has no associated symptoms with this. The pain does not radiate. She has no history of heart disease, DVT, or PE. No leg swelling.  Also, she complains of left arm pain. She has a history of neck surgery and she says that a screw broke off the hardware, but she has been unable to get this fixed because she lost her insurance. She has had a pain in her neck and left arm ever since this happened, it has just gotten slightly worse recently. No numbness in her hands. She has full strength in her hands.  Patient is a 53 y.o. female presenting with shoulder pain and chest pain.  Shoulder Pain Pertinent negatives include no chest pain, no abdominal pain and no shortness of breath.  Chest Pain Associated symptoms: nausea   Associated symptoms: no abdominal pain, no cough, no palpitations, no shortness of breath and not vomiting     Past Medical History  Diagnosis Date  . Asthma   . Shortness of breath   . Anemia   . GERD (gastroesophageal  reflux disease)   . Carpal tunnel syndrome    Past Surgical History  Procedure Laterality Date  . Back surgery    . Abation    . Colonoscopy  07/20/2011    Procedure: COLONOSCOPY;  Surgeon: Jeryl Columbia, MD;  Location: [facility] ENDOSCOPY;  Service: Endoscopy;  Laterality: N/A;   Family History  Problem Relation Age of Onset  . Anesthesia problems Neg Hx   . Hypotension Neg Hx   . Malignant hyperthermia Neg Hx   . Pseudochol deficiency Neg Hx    History  Substance Use Topics  . Smoking status: Never Smoker   . Smokeless tobacco: Not on file  . Alcohol Use: 3.6 oz/week    6 Cans of beer per week   OB History   Grav Para Term Preterm Abortions TAB SAB Ect Mult Living                 Review of Systems  HENT: Positive for congestion, postnasal drip, rhinorrhea, sinus pressure, sore throat and voice change. Negative for ear discharge and ear pain.   Respiratory: Positive for chest tightness and wheezing. Negative for cough and shortness of breath.   Cardiovascular: Negative for chest pain, palpitations and leg swelling.  Gastrointestinal: Positive for nausea. Negative for vomiting, abdominal pain and diarrhea.    Allergies  Tramadol hcl and Latex  Home Medications   Current Outpatient Rx  Name  Route  Sig  Dispense  Refill  . albuterol (  PROVENTIL) (2.5 MG/3ML) 0.083% nebulizer solution   Nebulization   Take 2.5 mg by nebulization every 6 (six) hours as needed. wheezing         . Aspirin-Salicylamide-Caffeine (BC FAST PAIN RELIEF) 650-195-33.3 MG PACK   Oral   Take 1 packet by mouth every 8 (eight) hours.         . Chlorpheniramine-PSE-Ibuprofen (ADVIL ALLERGY SINUS) 2-30-200 MG TABS      1-2 tabs PO Q4-6 hrs PRN   50 each   1   . fluticasone (FLONASE) 50 MCG/ACT nasal spray   Each Nare   Place 2 sprays into both nostrils 2 (two) times daily. Decrease to 2 sprays/nostril daily after 5 days   16 g   2   . HYDROcodone-acetaminophen (VICODIN) 5-500 MG per tablet    Oral   Take 1 tablet by mouth every 6 (six) hours as needed. pain         . meclizine (ANTIVERT) 25 MG tablet   Oral   Take 1 tablet (25 mg total) by mouth 4 (four) times daily.   12 tablet   0   . predniSONE (DELTASONE) 10 MG tablet      4 tabs PO QD for 4 days;  3 tabs PO QD for 3 days;  2 tabs PO QD for 2 days;  1 tab PO QD for 1 day   30 tablet   0    BP 159/79  Pulse 71  Temp(Src) 98.5 F (36.9 C) (Oral)  Resp 20  SpO2 100% Physical Exam  Nursing note and vitals reviewed. Constitutional: She is oriented to person, place, and time. Vital signs are normal. She appears well-developed and well-nourished. No distress.  HENT:  Head: Normocephalic and atraumatic.  Right Ear: External ear normal. A middle ear effusion (serous) is present.  Left Ear: External ear normal. Left ear middle ear effusion:  serous.  Nose: Mucosal edema and rhinorrhea present. Right sinus exhibits no maxillary sinus tenderness and no frontal sinus tenderness. Left sinus exhibits no maxillary sinus tenderness and no frontal sinus tenderness.  Mouth/Throat: Uvula is midline, oropharynx is clear and moist and mucous membranes are normal.  Neck: Normal range of motion. Neck supple.  Cardiovascular: Normal rate, regular rhythm and normal heart sounds.  Exam reveals no gallop and no friction rub.   No murmur heard. Pulmonary/Chest: Effort normal and breath sounds normal. No respiratory distress. She has no wheezes. She has no rales.  Lymphadenopathy:    She has no cervical adenopathy.  Neurological: She is alert and oriented to person, place, and time. She has normal strength. No cranial nerve deficit or sensory deficit. She exhibits normal muscle tone. She displays a negative Romberg sign. Coordination and gait normal. GCS eye subscore is 4. GCS verbal subscore is 5. GCS motor subscore is 6.  Skin: Skin is warm and dry. No rash noted. She is not diaphoretic.  Psychiatric: She has a normal mood and affect.  Judgment normal.    ED Course  Procedures (including critical care time) Labs Review Labs Reviewed - No data to display Imaging Review No results found.  EKG is normal  MDM   1. Positional vertigo   2. Allergic rhinitis   3. Allergic cough    Symptoms consistent with allergies and peripheral vertigo. Will treat accordingly. They're to the emergency room if the chest tightness gets worse or she starts to have any associated symptoms, discussed with the patient.  Meds ordered this encounter  Medications  .  predniSONE (DELTASONE) 10 MG tablet    Sig: 4 tabs PO QD for 4 days;  3 tabs PO QD for 3 days;  2 tabs PO QD for 2 days;  1 tab PO QD for 1 day    Dispense:  30 tablet    Refill:  0    Order Specific Question:  Supervising Provider    Answer:  Jake Michaelis, DAVID C [id number]  . fluticasone (FLONASE) 50 MCG/ACT nasal spray    Sig: Place 2 sprays into both nostrils 2 (two) times daily. Decrease to 2 sprays/nostril daily after 5 days    Dispense:  16 g    Refill:  2    Order Specific Question:  Supervising Provider    Answer:  Jake Michaelis, DAVID C [id number]  . Chlorpheniramine-PSE-Ibuprofen (ADVIL ALLERGY SINUS) 2-30-200 MG TABS    Sig: 1-2 tabs PO Q4-6 hrs PRN    Dispense:  50 each    Refill:  1    Order Specific Question:  Supervising Provider    Answer:  Jake Michaelis, DAVID C [id number]  . meclizine (ANTIVERT) 25 MG tablet    Sig: Take 1 tablet (25 mg total) by mouth 4 (four) times daily.    Dispense:  12 tablet    Refill:  0    Order Specific Question:  Supervising Provider    Answer:  Jake Michaelis, DAVID C [id number]  . albuterol (PROVENTIL HFA;VENTOLIN HFA) 108 (90 BASE) MCG/ACT inhaler 2 puff    Sig:        Liam Graham, PA-C 11/13/13 2003

## 2013-11-13 NOTE — ED Notes (Signed)
C/o  Left shoulder pain that radiates down left arm.     Chest tightness with nausea.  X 1wk.    States gets with waking Sunday felt dizzy when she bends over. Also with bend feels like she is going to pass out.

## 2014-05-22 ENCOUNTER — Other Ambulatory Visit (HOSPITAL_COMMUNITY): Payer: Self-pay | Admitting: Internal Medicine

## 2014-05-22 DIAGNOSIS — Z1231 Encounter for screening mammogram for malignant neoplasm of breast: Secondary | ICD-10-CM

## 2014-06-01 ENCOUNTER — Ambulatory Visit (HOSPITAL_COMMUNITY)
Admission: RE | Admit: 2014-06-01 | Discharge: 2014-06-01 | Disposition: A | Discharge status: Home / Self Care | Payer: No Typology Code available for payment source | Source: Ambulatory Visit | Attending: Internal Medicine | Admitting: Internal Medicine

## 2014-06-01 DIAGNOSIS — Z1231 Encounter for screening mammogram for malignant neoplasm of breast: Secondary | ICD-10-CM | POA: Insufficient documentation

## 2014-07-16 ENCOUNTER — Other Ambulatory Visit (HOSPITAL_COMMUNITY): Payer: Self-pay | Admitting: Physical Medicine and Rehabilitation

## 2014-07-16 DIAGNOSIS — M542 Cervicalgia: Secondary | ICD-10-CM

## 2014-07-18 ENCOUNTER — Ambulatory Visit (HOSPITAL_COMMUNITY): Payer: No Typology Code available for payment source

## 2014-07-18 ENCOUNTER — Ambulatory Visit (HOSPITAL_COMMUNITY)
Admission: RE | Admit: 2014-07-18 | Discharge: 2014-07-18 | Disposition: A | Discharge status: Home / Self Care | Payer: No Typology Code available for payment source | Source: Ambulatory Visit | Attending: Physical Medicine and Rehabilitation | Admitting: Physical Medicine and Rehabilitation

## 2014-07-18 DIAGNOSIS — M542 Cervicalgia: Secondary | ICD-10-CM | POA: Diagnosis present

## 2014-08-09 ENCOUNTER — Other Ambulatory Visit (HOSPITAL_COMMUNITY): Payer: Self-pay | Admitting: Orthopedic Surgery

## 2014-08-09 DIAGNOSIS — G959 Disease of spinal cord, unspecified: Secondary | ICD-10-CM

## 2014-08-28 ENCOUNTER — Ambulatory Visit (HOSPITAL_COMMUNITY)
Admission: RE | Admit: 2014-08-28 | Discharge: 2014-08-28 | Disposition: A | Discharge status: Home / Self Care | Payer: No Typology Code available for payment source | Source: Ambulatory Visit | Attending: Orthopedic Surgery | Admitting: Orthopedic Surgery

## 2014-08-28 ENCOUNTER — Ambulatory Visit (HOSPITAL_COMMUNITY): Admission: RE | Admit: 2014-08-28 | Payer: No Typology Code available for payment source | Source: Ambulatory Visit

## 2014-08-28 DIAGNOSIS — G959 Disease of spinal cord, unspecified: Secondary | ICD-10-CM | POA: Diagnosis not present

## 2015-01-27 ENCOUNTER — Encounter (HOSPITAL_COMMUNITY): Payer: Self-pay | Admitting: *Deleted

## 2015-01-27 ENCOUNTER — Emergency Department (HOSPITAL_COMMUNITY)
Admission: EM | Admit: 2015-01-27 | Discharge: 2015-01-27 | Disposition: A | Discharge status: Home / Self Care | Payer: No Typology Code available for payment source | Attending: Emergency Medicine | Admitting: Emergency Medicine

## 2015-01-27 DIAGNOSIS — H8111 Benign paroxysmal vertigo, right ear: Secondary | ICD-10-CM | POA: Diagnosis not present

## 2015-01-27 DIAGNOSIS — Z79899 Other long term (current) drug therapy: Secondary | ICD-10-CM | POA: Diagnosis not present

## 2015-01-27 DIAGNOSIS — Z862 Personal history of diseases of the blood and blood-forming organs and certain disorders involving the immune mechanism: Secondary | ICD-10-CM | POA: Insufficient documentation

## 2015-01-27 DIAGNOSIS — J45909 Unspecified asthma, uncomplicated: Secondary | ICD-10-CM | POA: Diagnosis not present

## 2015-01-27 DIAGNOSIS — Z7951 Long term (current) use of inhaled steroids: Secondary | ICD-10-CM | POA: Diagnosis not present

## 2015-01-27 DIAGNOSIS — Z8719 Personal history of other diseases of the digestive system: Secondary | ICD-10-CM | POA: Insufficient documentation

## 2015-01-27 DIAGNOSIS — R42 Dizziness and giddiness: Secondary | ICD-10-CM | POA: Diagnosis present

## 2015-01-27 MED ORDER — LORAZEPAM 2 MG/ML IJ SOLN
1.0000 mg | Freq: Once | INTRAMUSCULAR | Status: AC
  Administered 2015-01-27: 1 mg via INTRAVENOUS
  Filled 2015-01-27: qty 1

## 2015-01-27 MED ORDER — MECLIZINE HCL 50 MG PO TABS: 50.0000 mg | ORAL_TABLET | Freq: Two times a day (BID) | ORAL | Status: DC | PRN

## 2015-01-27 MED ORDER — SODIUM CHLORIDE 0.9 % IV BOLUS (SEPSIS)
1000.0000 mL | Freq: Once | INTRAVENOUS | Status: AC
  Administered 2015-01-27: 1000 mL via INTRAVENOUS

## 2015-01-27 NOTE — ED Notes (Signed)
Pt in from home c/o dizziness worse with movement onset yesterday, hx of vertigo, pt denies slurred speech, blurred vision, symptoms improve with closing her eyes, no facial droop present, AOx4, follows commands, speaks in complete sentences

## 2015-01-27 NOTE — ED Provider Notes (Signed)
CSN: [id number]     Arrival date & time 01/27/15  5573 History   First MD Initiated Contact with Patient 01/27/15 [phone]     Chief Complaint  Patient presents with  . Dizziness     (Consider location/radiation/quality/duration/timing/severity/associated sxs/prior Treatment) HPI   Blood pressure 140/76, pulse 78, temperature 98.3 F (36.8 C), temperature source Oral, resp. rate 16, height 5' 6.5" (1.689 m), weight 170 lb (77.111 kg), SpO2 99 %.  Teresa Mcmahon is a 54 y.o. female complaining of acute onset of vertiginous sensation exacerbated by standing, walking and moving her head to the right. She's had peripheral vertigo in the past with similar. She is having nausea with no vomiting, difficulty walking. She denies change in vision, dysarthria, chest pain, shortness of breath. She has no history of diabetes, hypertension, hyperlipidemia, CVA, tendinitis, otalgia, URI.   Past Medical History  Diagnosis Date  . Asthma   . Shortness of breath   . Anemia   . GERD (gastroesophageal reflux disease)   . Carpal tunnel syndrome    Past Surgical History  Procedure Laterality Date  . Back surgery    . Abation    . Colonoscopy  07/20/2011    Procedure: COLONOSCOPY;  Surgeon: Jeryl Columbia, MD;  Location: [facility] ENDOSCOPY;  Service: Endoscopy;  Laterality: N/A;   Family History  Problem Relation Age of Onset  . Anesthesia problems Neg Hx   . Hypotension Neg Hx   . Malignant hyperthermia Neg Hx   . Pseudochol deficiency Neg Hx    History  Substance Use Topics  . Smoking status: Never Smoker   . Smokeless tobacco: Not on file  . Alcohol Use: 3.6 oz/week    6 Cans of beer per week   OB History    No data available     Review of Systems  10 systems reviewed and found to be negative, except as noted in the HPI.   Allergies  Tramadol hcl and Latex  Home Medications   Prior to Admission medications   Medication Sig Start Date End Date Taking? Authorizing Provider  albuterol  (PROVENTIL) (2.5 MG/3ML) 0.083% nebulizer solution Take 2.5 mg by nebulization every 6 (six) hours as needed. wheezing   Yes Historical Provider, MD  Aspirin-Salicylamide-Caffeine (BC FAST PAIN RELIEF) 650-195-33.3 MG PACK Take 1 packet by mouth every 8 (eight) hours.   Yes Historical Provider, MD  cetirizine (ZYRTEC) 10 MG tablet Take 10 mg by mouth daily.   Yes Historical Provider, MD  fluticasone (FLONASE) 50 MCG/ACT nasal spray Place 2 sprays into both nostrils 2 (two) times daily. Decrease to 2 sprays/nostril daily after 5 days 11/13/13  Yes Liam Graham, PA-C  ibuprofen (ADVIL,MOTRIN) 800 MG tablet Take 800 mg by mouth every 8 (eight) hours as needed for moderate pain.   Yes Historical Provider, MD  montelukast (SINGULAIR) 10 MG tablet Take 10 mg by mouth at bedtime.   Yes Historical Provider, MD  meclizine (ANTIVERT) 50 MG tablet Take 1 tablet (50 mg total) by mouth 2 (two) times daily as needed for dizziness or nausea. 01/27/15   Sherrin Stahle, PA-C   BP 157/75 mmHg  Pulse 65  Temp(Src) 97.5 F (36.4 C) (Oral)  Resp 12  Ht 5' 6.5" (1.689 m)  Wt 170 lb (77.111 kg)  BMI 27.03 kg/m2  SpO2 100% Physical Exam  Constitutional: She is oriented to person, place, and time. She appears well-developed and well-nourished. No distress.  HENT:  Head: Normocephalic and atraumatic.  Mouth/Throat:  Oropharynx is clear and moist.  Eyes: Conjunctivae and EOM are normal. Pupils are equal, round, and reactive to light.  No TTP of maxillary or frontal sinuses  No TTP or induration of temporal arteries bilaterally  Neck: Normal range of motion. Neck supple.  FROM to C-spine. Pt can touch chin to chest without discomfort. No TTP of midline cervical spine.   Cardiovascular: Normal rate, regular rhythm and intact distal pulses.   Pulmonary/Chest: Effort normal and breath sounds normal. No stridor. No respiratory distress. She has no wheezes. She has no rales. She exhibits no tenderness.  Abdominal:  Soft. Bowel sounds are normal. There is no tenderness.  Musculoskeletal: Normal range of motion. She exhibits no edema or tenderness.  Neurological: She is alert and oriented to person, place, and time. No cranial nerve deficit.  II-Visual fields grossly intact. III/IV/VI-Extraocular movements intact.  Pupils reactive bilaterally. V/VII-Smile symmetric, equal eyebrow raise,  facial sensation intact VIII- Hearing grossly intact IX/X-Normal gag XI-bilateral shoulder shrug XII-midline tongue extension Motor: 5/5 bilaterally with normal tone and bulk Cerebellar: Normal finger-to-nose  and normal heel-to-shin test.   Romberg negative Ambulates with a coordinated gait  Dix-Hallpike is positive on the right  Psychiatric: She has a normal mood and affect.  Nursing note and vitals reviewed.   ED Course  Procedures (including critical care time) Labs Review Labs Reviewed - No data to display  Imaging Review No results found.   EKG Interpretation   Date/Time:  Sunday January 27 2015 08:13:31 EDT Ventricular Rate:  61 PR Interval:  161 QRS Duration: 75 QT Interval:  399 QTC Calculation: 402 R Axis:   83 Text Interpretation:  Sinus rhythm Inferior T inversions, noted 11-13-2013  Confirmed by Jeneen Rinks  MD, [place] ([id number]) on 01/27/2015 8:16:56 AM      MDM   Final diagnoses:  BPPV (benign paroxysmal positional vertigo), right    Filed Vitals:   01/27/15 0930 01/27/15 0945 01/27/15 1000 01/27/15 1001  BP: 145/104 152/84 157/75 157/75  Pulse: 74 69 67 65  Temp:    97.5 F (36.4 C)  TempSrc:    Oral  Resp: 19 12 17 12   Height:      Weight:      SpO2: 100% 100% 100% 100%    Medications  sodium chloride 0.9 % bolus 1,000 mL (0 mLs Intravenous Stopped 01/27/15 0922)  LORazepam (ATIVAN) injection 1 mg (1 mg Intravenous Given 01/27/15 6381)    Teresa Mcmahon is a pleasant 54 y.o. female presenting with acute onset of vertigo, exacerbated by head movement. Neuro exam is nonfocal,  likely BPPV. Will bolus and given Ativan for comfort. Instructed patient on Epley maneuver.  Evaluation does not show pathology that would require ongoing emergent intervention or inpatient treatment. Pt is hemodynamically stable and mentating appropriately. Discussed findings and plan with patient/guardian, who agrees with care plan. All questions answered. Return precautions discussed and outpatient follow up given.   New Prescriptions   MECLIZINE (ANTIVERT) 50 MG TABLET    Take 1 tablet (50 mg total) by mouth 2 (two) times daily as needed for dizziness or nausea.         Monico Blitz, PA-C 01/27/15 1029  Tanna Furry, MD 02/05/15 [id number]

## 2015-01-27 NOTE — Discharge Instructions (Signed)
Benign Positional Vertigo °Vertigo means you feel like you or your surroundings are moving when they are not. Benign positional vertigo is the most common form of vertigo. Benign means that the cause of your condition is not serious. Benign positional vertigo is more common in older adults. °CAUSES  °Benign positional vertigo is the result of an upset in the labyrinth system. This is an area in the middle ear that helps control your balance. This may be caused by a viral infection, head injury, or repetitive motion. However, often no specific cause is found. °SYMPTOMS  °Symptoms of benign positional vertigo occur when you move your head or eyes in different directions. Some of the symptoms may include: °1. Loss of balance and falls. °2. Vomiting. °3. Blurred vision. °4. Dizziness. °5. Nausea. °6. Involuntary eye movements (nystagmus). °DIAGNOSIS  °Benign positional vertigo is usually diagnosed by physical exam. If the specific cause of your benign positional vertigo is unknown, your caregiver may perform imaging tests, such as magnetic resonance imaging (MRI) or computed tomography (CT). °TREATMENT  °Your caregiver may recommend movements or procedures to correct the benign positional vertigo. Medicines such as meclizine, benzodiazepines, and medicines for nausea may be used to treat your symptoms. In rare cases, if your symptoms are caused by certain conditions that affect the inner ear, you may need surgery. °HOME CARE INSTRUCTIONS  °· Follow your caregiver's instructions. °· Move slowly. Do not make sudden body or head movements. °· Avoid driving. °· Avoid operating heavy machinery. °· Avoid performing any tasks that would be dangerous to you or others during a vertigo episode. °· Drink enough fluids to keep your urine clear or pale yellow. °SEEK IMMEDIATE MEDICAL CARE IF:  °· You develop problems with walking, weakness, numbness, or using your arms, hands, or legs. °· You have difficulty speaking. °· You develop  severe headaches. °· Your nausea or vomiting continues or gets worse. °· You develop visual changes. °· Your family or friends notice any behavioral changes. °· Your condition gets worse. °· You have a fever. °· You develop a stiff neck or sensitivity to light. °MAKE SURE YOU:  °· Understand these instructions. °· Will watch your condition. °· Will get help right away if you are not doing well or get worse. °Document Released: 05/04/2006 Document Revised: 10/19/2011 Document Reviewed: 04/16/2011 °[employer]® Patient Information ©2015 [employer]. This information is not intended to replace advice given to you by your health care provider. Make sure you discuss any questions you have with your health care provider. ° °Epley Maneuver Self-Care °WHAT IS THE EPLEY MANEUVER? °The Epley maneuver is an exercise you can do to relieve symptoms of benign paroxysmal positional vertigo (BPPV). This condition is often just referred to as vertigo. BPPV is caused by the movement of tiny crystals (canaliths) inside your inner ear. The accumulation and movement of canaliths in your inner ear causes a sudden spinning sensation (vertigo) when you move your head to certain positions. Vertigo usually lasts about 30 seconds. BPPV usually occurs in just one ear. If you get vertigo when you lie on your left side, you probably have BPPV in your left ear. Your health care provider can tell you which ear is involved.  °BPPV may be caused by a head injury. Many people older than 50 get BPPV for unknown reasons. If you have been diagnosed with BPPV, your health care provider may teach you how to do this maneuver. BPPV is not life threatening (benign) and usually goes away in time.  °  WHEN SHOULD I PERFORM THE EPLEY MANEUVER? °You can do this maneuver at home whenever you have symptoms of vertigo. You may do the Epley maneuver up to 3 times a day until your symptoms of vertigo go away. °HOW SHOULD I DO THE EPLEY MANEUVER? °7. Sit on the edge of a  bed or table with your back straight. Your legs should be extended or hanging over the edge of the bed or table.   °8. Turn your head halfway toward the affected ear.   °9. Lie backward quickly with your head turned until you are lying flat on your back. You may want to position a pillow under your shoulders.   °10. Hold this position for 30 seconds. You may experience an attack of vertigo. This is normal. Hold this position until the vertigo stops. °11. Then turn your head to the opposite direction until your unaffected ear is facing the floor.   °12. Hold this position for 30 seconds. You may experience an attack of vertigo. This is normal. Hold this position until the vertigo stops. °13. Now turn your whole body to the same side as your head. Hold for another 30 seconds.   °14. You can then sit back up. °ARE THERE RISKS TO THIS MANEUVER? °In some cases, you may have other symptoms (such as changes in your vision, weakness, or numbness). If you have these symptoms, stop doing the maneuver and call your health care provider. Even if doing these maneuvers relieves your vertigo, you may still have dizziness. Dizziness is the sensation of light-headedness but without the sensation of movement. Even though the Epley maneuver may relieve your vertigo, it is possible that your symptoms will return within 5 years. °WHAT SHOULD I DO AFTER THIS MANEUVER? °After doing the Epley maneuver, you can return to your normal activities. Ask your doctor if there is anything you should do at home to prevent vertigo. This may include: °· Sleeping with two or more pillows to keep your head elevated. °· Not sleeping on the side of your affected ear. °· Getting up slowly from bed. °· Avoiding sudden movements during the day. °· Avoiding extreme head movement, like looking up or bending over. °· Wearing a cervical collar to prevent sudden head movements. °WHAT SHOULD I DO IF MY SYMPTOMS GET WORSE? °Call your health care provider if your  vertigo gets worse. Call your provider right way if you have other symptoms, including:  °· Nausea. °· Vomiting. °· Headache. °· Weakness. °· Numbness. °· Vision changes. °Document Released: 08/01/2013 Document Reviewed: 08/01/2013 °[employer]® Patient Information ©2015 [employer]. This information is not intended to replace advice given to you by your health care provider. Make sure you discuss any questions you have with your health care provider. ° °

## 2015-02-11 ENCOUNTER — Emergency Department (HOSPITAL_COMMUNITY)
Admission: EM | Admit: 2015-02-11 | Discharge: 2015-02-11 | Disposition: A | Discharge status: Home / Self Care | Payer: No Typology Code available for payment source | Attending: Emergency Medicine | Admitting: Emergency Medicine

## 2015-02-11 ENCOUNTER — Encounter (HOSPITAL_COMMUNITY): Payer: Self-pay | Admitting: Emergency Medicine

## 2015-02-11 DIAGNOSIS — Z7982 Long term (current) use of aspirin: Secondary | ICD-10-CM | POA: Diagnosis not present

## 2015-02-11 DIAGNOSIS — J45909 Unspecified asthma, uncomplicated: Secondary | ICD-10-CM | POA: Diagnosis not present

## 2015-02-11 DIAGNOSIS — Z8669 Personal history of other diseases of the nervous system and sense organs: Secondary | ICD-10-CM | POA: Diagnosis not present

## 2015-02-11 DIAGNOSIS — M25562 Pain in left knee: Secondary | ICD-10-CM | POA: Diagnosis not present

## 2015-02-11 DIAGNOSIS — Z91040 Latex allergy status: Secondary | ICD-10-CM | POA: Diagnosis not present

## 2015-02-11 DIAGNOSIS — Z79899 Other long term (current) drug therapy: Secondary | ICD-10-CM | POA: Insufficient documentation

## 2015-02-11 DIAGNOSIS — Z862 Personal history of diseases of the blood and blood-forming organs and certain disorders involving the immune mechanism: Secondary | ICD-10-CM | POA: Insufficient documentation

## 2015-02-11 DIAGNOSIS — Z7951 Long term (current) use of inhaled steroids: Secondary | ICD-10-CM | POA: Diagnosis not present

## 2015-02-11 DIAGNOSIS — Z8719 Personal history of other diseases of the digestive system: Secondary | ICD-10-CM | POA: Diagnosis not present

## 2015-02-11 MED ORDER — METHOCARBAMOL 500 MG PO TABS: 500.0000 mg | ORAL_TABLET | Freq: Two times a day (BID) | ORAL | Status: DC

## 2015-02-11 NOTE — ED Notes (Addendum)
Pt reports left knee pain starting at work 4 days ago. Pt reports taking ibuprofen, using a heat pad, and using muscle cream with limited relief. Pt alert x4. NAD at this time. Pt denies injury or trauma. Pt reports the swelling is improving.

## 2015-02-11 NOTE — Discharge Instructions (Signed)
Cryotherapy °Cryotherapy means treatment with cold. Ice or gel packs can be used to reduce both pain and swelling. Ice is the most helpful within the first 24 to 48 hours after an injury or flare-up from overusing a muscle or joint. Sprains, strains, spasms, burning pain, shooting pain, and aches can all be eased with ice. Ice can also be used when recovering from surgery. Ice is effective, has very few side effects, and is safe for most people to use. °PRECAUTIONS  °Ice is not a safe treatment option for people with: °· Raynaud phenomenon. This is a condition affecting small blood vessels in the extremities. Exposure to cold may cause your problems to return. °· Cold hypersensitivity. There are many forms of cold hypersensitivity, including: °¨ Cold urticaria. Red, itchy hives appear on the skin when the tissues begin to warm after being iced. °¨ Cold erythema. This is a red, itchy rash caused by exposure to cold. °¨ Cold hemoglobinuria. Red blood cells break down when the tissues begin to warm after being iced. The hemoglobin that carry oxygen are passed into the urine because they cannot combine with blood proteins fast enough. °· Numbness or altered sensitivity in the area being iced. °If you have any of the following conditions, do not use ice until you have discussed cryotherapy with your caregiver: °· Heart conditions, such as arrhythmia, angina, or chronic heart disease. °· High blood pressure. °· Healing wounds or open skin in the area being iced. °· Current infections. °· Rheumatoid arthritis. °· Poor circulation. °· Diabetes. °Ice slows the blood flow in the region it is applied. This is beneficial when trying to stop inflamed tissues from spreading irritating chemicals to surrounding tissues. However, if you expose your skin to cold temperatures for too long or without the proper protection, you can damage your skin or nerves. Watch for signs of skin damage due to cold. °HOME CARE INSTRUCTIONS °Follow  these tips to use ice and cold packs safely. °· Place a dry or damp towel between the ice and skin. A damp towel will cool the skin more quickly, so you may need to shorten the time that the ice is used. °· For a more rapid response, add gentle compression to the ice. °· Ice for no more than 10 to 20 minutes at a time. The bonier the area you are icing, the less time it will take to get the benefits of ice. °· Check your skin after 5 minutes to make sure there are no signs of a poor response to cold or skin damage. °· Rest 20 minutes or more between uses. °· Once your skin is numb, you can end your treatment. You can test numbness by very lightly touching your skin. The touch should be so light that you do not see the skin dimple from the pressure of your fingertip. When using ice, most people will feel these normal sensations in this order: cold, burning, aching, and numbness. °· Do not use ice on someone who cannot communicate their responses to pain, such as small children or people with dementia. °HOW TO MAKE AN ICE PACK °Ice packs are the most common way to use ice therapy. Other methods include ice massage, ice baths, and cryosprays. Muscle creams that cause a cold, tingly feeling do not offer the same benefits that ice offers and should not be used as a substitute unless recommended by your caregiver. °To make an ice pack, do one of the following: °· Place crushed ice or a   bag of frozen vegetables in a sealable plastic bag. Squeeze out the excess air. Place this bag inside another plastic bag. Slide the bag into a pillowcase or place a damp towel between your skin and the bag.  Mix 3 parts water with 1 part rubbing alcohol. Freeze the mixture in a sealable plastic bag. When you remove the mixture from the freezer, it will be slushy. Squeeze out the excess air. Place this bag inside another plastic bag. Slide the bag into a pillowcase or place a damp towel between your skin and the bag. SEEK MEDICAL CARE  IF:  You develop white spots on your skin. This may give the skin a blotchy (mottled) appearance.  Your skin turns blue or pale.  Your skin becomes waxy or hard.  Your swelling gets worse. MAKE SURE YOU:   Understand these instructions.  Will watch your condition.  Will get help right away if you are not doing well or get worse. Document Released: 03/23/2011 Document Revised: 12/11/2013 Document Reviewed: 03/23/2011 [facility] Patient Information 2015 [place], [place]. This information is not intended to replace advice given to you by your health care provider. Make sure you discuss any questions you have with your health care provider. Knee Pain The knee is the complex joint between your thigh and your lower leg. It is made up of bones, tendons, ligaments, and cartilage. The bones that make up the knee are:  The femur in the thigh.  The tibia and fibula in the lower leg.  The patella or kneecap riding in the groove on the lower femur. CAUSES  Knee pain is a common complaint with many causes. A few of these causes are:  Injury, such as:  A ruptured ligament or tendon injury.  Torn cartilage.  Medical conditions, such as:  Gout  Arthritis  Infections  Overuse, over training, or overdoing a physical activity. Knee pain can be minor or severe. Knee pain can accompany debilitating injury. Minor knee problems often respond well to self-care measures or get well on their own. More serious injuries may need medical intervention or even surgery. SYMPTOMS The knee is complex. Symptoms of knee problems can vary widely. Some of the problems are:  Pain with movement and weight bearing.  Swelling and tenderness.  Buckling of the knee.  Inability to straighten or extend your knee.  Your knee locks and you cannot straighten it.  Warmth and redness with pain and fever.  Deformity or dislocation of the kneecap. DIAGNOSIS  Determining what is wrong may be very straight  forward such as when there is an injury. It can also be challenging because of the complexity of the knee. Tests to make a diagnosis may include:  Your caregiver taking a history and doing a physical exam.  Routine X-rays can be used to rule out other problems. X-rays will not reveal a cartilage tear. Some injuries of the knee can be diagnosed by:  Arthroscopy a surgical technique by which a small video camera is inserted through tiny incisions on the sides of the knee. This procedure is used to examine and repair internal knee joint problems. Tiny instruments can be used during arthroscopy to repair the torn knee cartilage (meniscus).  Arthrography is a radiology technique. A contrast liquid is directly injected into the knee joint. Internal structures of the knee joint then become visible on X-ray film.  An MRI scan is a non X-ray radiology procedure in which magnetic fields and a computer produce two- or three-dimensional images of  the inside of the knee. Cartilage tears are often visible using an MRI scanner. MRI scans have largely replaced arthrography in diagnosing cartilage tears of the knee.  Blood work.  Examination of the fluid that helps to lubricate the knee joint (synovial fluid). This is done by taking a sample out using a needle and a syringe. TREATMENT The treatment of knee problems depends on the cause. Some of these treatments are:  Depending on the injury, proper casting, splinting, surgery, or physical therapy care will be needed.  Give yourself adequate recovery time. Do not overuse your joints. If you begin to get sore during workout routines, back off. Slow down or do fewer repetitions.  For repetitive activities such as cycling or running, maintain your strength and nutrition.  Alternate muscle groups. For example, if you are a weight lifter, work the upper body on one day and the lower body the next.  Either tight or weak muscles do not give the proper support for  your knee. Tight or weak muscles do not absorb the stress placed on the knee joint. Keep the muscles surrounding the knee strong.  Take care of mechanical problems.  If you have flat feet, orthotics or special shoes may help. See your caregiver if you need help.  Arch supports, sometimes with wedges on the inner or outer aspect of the heel, can help. These can shift pressure away from the side of the knee most bothered by osteoarthritis.  A brace called an "unloader" brace also may be used to help ease the pressure on the most arthritic side of the knee.  If your caregiver has prescribed crutches, braces, wraps or ice, use as directed. The acronym for this is PRICE. This means protection, rest, ice, compression, and elevation.  Nonsteroidal anti-inflammatory drugs (NSAIDs), can help relieve pain. But if taken immediately after an injury, they may actually increase swelling. Take NSAIDs with food in your stomach. Stop them if you develop stomach problems. Do not take these if you have a history of ulcers, stomach pain, or bleeding from the bowel. Do not take without your caregiver's approval if you have problems with fluid retention, heart failure, or kidney problems.  For ongoing knee problems, physical therapy may be helpful.  Glucosamine and chondroitin are over-the-counter dietary supplements. Both may help relieve the pain of osteoarthritis in the knee. These medicines are different from the usual anti-inflammatory drugs. Glucosamine may decrease the rate of cartilage destruction.  Injections of a corticosteroid drug into your knee joint may help reduce the symptoms of an arthritis flare-up. They may provide pain relief that lasts a few months. You may have to wait a few months between injections. The injections do have a small increased risk of infection, water retention, and elevated blood sugar levels.  Hyaluronic acid injected into damaged joints may ease pain and provide lubrication.  These injections may work by reducing inflammation. A series of shots may give relief for as long as 6 months.  Topical painkillers. Applying certain ointments to your skin may help relieve the pain and stiffness of osteoarthritis. Ask your pharmacist for suggestions. Many over the-counter products are approved for temporary relief of arthritis pain.  In some countries, doctors often prescribe topical NSAIDs for relief of chronic conditions such as arthritis and tendinitis. A review of treatment with NSAID creams found that they worked as well as oral medications but without the serious side effects. PREVENTION  Maintain a healthy weight. Extra pounds put more strain on your joints.  Get strong, stay limber. Weak muscles are a common cause of knee injuries. Stretching is important. Include flexibility exercises in your workouts.  Be smart about exercise. If you have osteoarthritis, chronic knee pain or recurring injuries, you may need to change the way you exercise. This does not mean you have to stop being active. If your knees ache after jogging or playing basketball, consider switching to swimming, water aerobics, or other low-impact activities, at least for a few days a week. Sometimes limiting high-impact activities will provide relief.  Make sure your shoes fit well. Choose footwear that is right for your sport.  Protect your knees. Use the proper gear for knee-sensitive activities. Use kneepads when playing volleyball or laying carpet. Buckle your seat belt every time you drive. Most shattered kneecaps occur in car accidents.  Rest when you are tired. SEEK MEDICAL CARE IF:  You have knee pain that is continual and does not seem to be getting better.  SEEK IMMEDIATE MEDICAL CARE IF:  Your knee joint feels hot to the touch and you have a high fever. MAKE SURE YOU:   Understand these instructions.  Will watch your condition.  Will get help right away if you are not doing well or get  worse. Document Released: 05/24/2007 Document Revised: 10/19/2011 Document Reviewed: 05/24/2007 [facility] Patient Information 2015 [place], [place]. This information is not intended to replace advice given to you by your health care provider. Make sure you discuss any questions you have with your health care provider.

## 2015-02-11 NOTE — ED Provider Notes (Signed)
CSN: [id number]     Arrival date & time 02/11/15  0847 History   First MD Initiated Contact with Patient 02/11/15 [phone]     Chief Complaint  Patient presents with  . Knee Pain     (Consider location/radiation/quality/duration/timing/severity/associated sxs/prior Treatment) HPI Comments: Patient here complaining of left knee pain 4 days. Patient does lots of standing at work and states that it was swollen a few days ago and improved after she used ice as well as took ibuprofen. Denies any calf or thigh swelling. Denies any shortness of breath. No direct trauma to her left knee. Symptoms have been gradually improving. They're somewhat worse with standing.  Patient is a 54 y.o. female presenting with knee pain. The history is provided by the patient.  Knee Pain   Past Medical History  Diagnosis Date  . Asthma   . Shortness of breath   . Anemia   . GERD (gastroesophageal reflux disease)   . Carpal tunnel syndrome    Past Surgical History  Procedure Laterality Date  . Back surgery    . Abation    . Colonoscopy  07/20/2011    Procedure: COLONOSCOPY;  Surgeon: Jeryl Columbia, MD;  Location: [facility];  Service: Endoscopy;  Laterality: N/A;   Family History  Problem Relation Age of Onset  . Anesthesia problems Neg Hx   . Hypotension Neg Hx   . Malignant hyperthermia Neg Hx   . Pseudochol deficiency Neg Hx    History  Substance Use Topics  . Smoking status: Never Smoker   . Smokeless tobacco: Not on file  . Alcohol Use: 3.6 oz/week    6 Cans of beer per week   OB History    No data available     Review of Systems  All other systems reviewed and are negative.     Allergies  Tramadol hcl and Latex  Home Medications   Prior to Admission medications   Medication Sig Start Date End Date Taking? Authorizing Provider  albuterol (PROVENTIL) (2.5 MG/3ML) 0.083% nebulizer solution Take 2.5 mg by nebulization every 6 (six) hours as needed. wheezing    Historical Provider, MD   Aspirin-Salicylamide-Caffeine (BC FAST PAIN RELIEF) 650-195-33.3 MG PACK Take 1 packet by mouth every 8 (eight) hours.    Historical Provider, MD  cetirizine (ZYRTEC) 10 MG tablet Take 10 mg by mouth daily.    Historical Provider, MD  fluticasone (FLONASE) 50 MCG/ACT nasal spray Place 2 sprays into both nostrils 2 (two) times daily. Decrease to 2 sprays/nostril daily after 5 days 11/13/13   Liam Graham, PA-C  ibuprofen (ADVIL,MOTRIN) 800 MG tablet Take 800 mg by mouth every 8 (eight) hours as needed for moderate pain.    Historical Provider, MD  meclizine (ANTIVERT) 50 MG tablet Take 1 tablet (50 mg total) by mouth 2 (two) times daily as needed for dizziness or nausea. 01/27/15   Nicole Pisciotta, PA-C  methocarbamol (ROBAXIN) 500 MG tablet Take 1 tablet (500 mg total) by mouth 2 (two) times daily. 02/11/15   Lacretia Leigh, MD  montelukast (SINGULAIR) 10 MG tablet Take 10 mg by mouth at bedtime.    Historical Provider, MD   BP 135/73 mmHg  Pulse 81  Temp(Src) 97.9 F (36.6 C) (Oral)  Resp 18  SpO2 100% Physical Exam  Constitutional: She is oriented to person, place, and time. She appears well-developed and well-nourished.  Non-toxic appearance. No distress.  HENT:  Head: Normocephalic and atraumatic.  Eyes: Conjunctivae, EOM and lids are  normal. Pupils are equal, round, and reactive to light.  Neck: Normal range of motion. Neck supple. No tracheal deviation present. No thyroid mass present.  Cardiovascular: Normal rate, regular rhythm and normal heart sounds.  Exam reveals no gallop.   No murmur heard. Pulmonary/Chest: Effort normal and breath sounds normal. No stridor. No respiratory distress. She has no decreased breath sounds. She has no wheezes. She has no rhonchi. She has no rales.  Abdominal: Soft. Normal appearance and bowel sounds are normal. She exhibits no distension. There is no tenderness. There is no rebound and no CVA tenderness.  Musculoskeletal: Normal range of motion. She  exhibits no edema or tenderness.       Legs: Neurological: She is alert and oriented to person, place, and time. She has normal strength. No cranial nerve deficit or sensory deficit. GCS eye subscore is 4. GCS verbal subscore is 5. GCS motor subscore is 6.  Skin: Skin is warm and dry. No abrasion and no rash noted.  Psychiatric: She has a normal mood and affect. Her speech is normal and behavior is normal.  Nursing note and vitals reviewed.   ED Course  Procedures (including critical care time) Labs Review Labs Reviewed - No data to display  Imaging Review No results found.   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 No concern for DVT. Joint is not warm doubt septic joint. Patient will continue her current therapy and given orthopedic referral    Lacretia Leigh, MD 02/11/15 [phone]

## 2015-06-05 DIAGNOSIS — M4712 Other spondylosis with myelopathy, cervical region: Secondary | ICD-10-CM | POA: Insufficient documentation

## 2015-08-10 ENCOUNTER — Emergency Department (HOSPITAL_COMMUNITY)
Admission: EM | Admit: 2015-08-10 | Discharge: 2015-08-10 | Disposition: A | Discharge status: Home / Self Care | Payer: No Typology Code available for payment source | Source: Home / Self Care | Attending: Family Medicine | Admitting: Family Medicine

## 2015-08-10 ENCOUNTER — Encounter (HOSPITAL_COMMUNITY): Payer: Self-pay | Admitting: Emergency Medicine

## 2015-08-10 DIAGNOSIS — M76892 Other specified enthesopathies of left lower limb, excluding foot: Secondary | ICD-10-CM

## 2015-08-10 DIAGNOSIS — M6580 Other synovitis and tenosynovitis, unspecified site: Secondary | ICD-10-CM | POA: Diagnosis not present

## 2015-08-10 MED ORDER — PREDNISONE 20 MG PO TABS: ORAL_TABLET | ORAL | Status: DC

## 2015-08-10 MED ORDER — DICLOFENAC SODIUM 1 % TD GEL: 1.0000 "application " | Freq: Four times a day (QID) | TRANSDERMAL | Status: DC

## 2015-08-10 NOTE — ED Notes (Signed)
Here with left knee stiff, achy constant pain that started 2 weeks ago Pain with stretching, movement and bending Denies injury or strain Hx Arthritis in R knee, s/p knee injection  Tried otc pain relievers without relief

## 2015-08-10 NOTE — Discharge Instructions (Signed)
Tendinitis and Tenosynovitis  Retry wearing your knee brace for the next few days. Apply ice to the medial aspect of the knee with the pain is 2-3 times a day. Limit stairclimbing. Tendinitis is inflammation of the tendon. Tenosynovitis is inflammation of the lining around the tendon (tendon sheath). These painful conditions often occur at once. Tendons attach muscle to bone. To move a limb, force from the muscle moves through the tendon, to the bone. These conditions often cause increased pain when moving. Tendinitis may be caused by a small or partial tear in the tendon.  SYMPTOMS   Pain, tenderness, redness, bruising, or swelling at the injury.  Loss of normal joint movement.  Pain that gets worse with use of the muscle and joint attached to the tendon.  Weakness in the tendon, caused by calcium build up that may occur with tendinitis.  Commonly affected tendons:  Achilles tendon (calf of leg).  Rotator cuff (shoulder joint).  Patellar tendon (kneecap to shin).  Peroneal tendon (ankle).  Posterior tibial tendon (inner ankle).  Biceps tendon (in front of shoulder). CAUSES   Sudden strain on a flexed muscle, muscle overuse, sudden increase or change in activity, vigorous activity.  Result of a direct hit (less common).  Poor muscle action (biomechanics). RISK INCREASES WITH:  Injury (trauma).  Too much exercise.  Sudden change in athletic activity.  Incorrect exercise form or technique.  Poor strength and flexibility.  Not warming-up properly before activity.  Returning to activity before healing is complete. PREVENTION   Warm-up and stretch properly before activity.  Maintain physical fitness:  Joint flexibility.  Muscle strength and endurance.  Fitness that increases heart rate.  Learn and use proper exercise techniques.  Use rehabilitation exercises to strengthen weak muscles and tendons.  Ice the tendon after activity, to reduce recurring  inflammation.  Wear proper fitting protective equipment for specific tendons, when indicated. PROGNOSIS  When treated properly, can be cured in 6 to 8 weeks. Recovery may take longer, depending on degree of injury.  RELATED COMPLICATIONS   Re-injury or recurring symptoms.  Permanent weakness or joint stiffness, if injury is severe and recovery is not completed.  Delayed healing, if sports are started before healing is complete.  Tearing apart (rupture) of the inflamed tendon. Tendinitis means the tendon is injured and must recover. TREATMENT  Treatment first involves ice, medicine, and rest from aggravating activities. This reduces pain and inflammation. Modifying your activity may be considered to prevent recurring injury. A brace, elastic bandage wrap, splint, cast, or sling may be prescribed to protect the joint for a short period. After that period, strengthening and stretching exercise may help to regain strength and full range of motion. If the condition persists, despite non-surgical treatment, surgery may be recommended to remove the inflamed tendon lining. Corticosteroid injections may be given to reduce inflammation. However, these injections may weaken the tendon and increase your risk for tendon rupture. MEDICATION   If pain medicine is needed, nonsteroidal anti-inflammatory medicines (aspirin and ibuprofen), or other minor pain relievers (acetaminophen), are often recommended.  Do not take pain medicine for 7 days before surgery.  Prescription pain relievers are usually prescribed only after surgery. Use only as directed and only as much as you need.  Ointments applied to the skin may be helpful.  Corticosteroid injections may be given to reduce inflammation. However, this may increase your risk of a tendon rupture. HEAT AND COLD  Cold treatment (icing) relieves pain and reduces inflammation. Cold treatment should  be applied for 10 to 15 minutes every 2 to 3 hours, and  immediately after activity that aggravates your symptoms. Use ice packs or an ice massage.  Heat treatment may be used before performing stretching and strengthening activities prescribed by your caregiver, physical therapist, or athletic trainer. Use a heat pack or a warm water soak. SEEK MEDICAL CARE IF:   Symptoms get worse or do not improve, despite treatment.  Pain becomes too much to tolerate.  You develop numbness or tingling.  Toes become cold, or toenails become blue, gray, or dark colored.  New, unexplained symptoms develop. (Drugs used in treatment may produce side effects.)   This information is not intended to replace advice given to you by your health care provider. Make sure you discuss any questions you have with your health care provider.   Document Released: 07/27/2005 Document Revised: 10/19/2011 Document Reviewed: 11/08/2008 [employer] Interactive Patient Education [employer].

## 2015-08-10 NOTE — ED Provider Notes (Signed)
CSN: [id number]     Arrival date & time 08/10/15  1228 History   First MD Initiated Contact with Patient 08/10/15 1400     Chief Complaint  Patient presents with  . Knee Pain   (Consider location/radiation/quality/duration/timing/severity/associated sxs/prior Treatment) HPI Comments: 54 year old female complaining of left knee pain for approximately 2 weeks. Pain is located primarily to the medial aspect. She describes it as an achy pain and is exacerbated by climbing stairs, flexion beyond 90 and weight bearing. Denies any known injury. She has a history of arthritis in the right knee with a flareup approximately one year ago.  Patient is a 54 y.o. female presenting with knee pain.  Knee Pain Associated symptoms: no neck pain     Past Medical History  Diagnosis Date  . Asthma   . Shortness of breath   . Anemia   . GERD (gastroesophageal reflux disease)   . Carpal tunnel syndrome    Past Surgical History  Procedure Laterality Date  . Back surgery    . Abation    . Colonoscopy  07/20/2011    Procedure: COLONOSCOPY;  Surgeon: Jeryl Columbia, MD;  Location: [facility] ENDOSCOPY;  Service: Endoscopy;  Laterality: N/A;   Family History  Problem Relation Age of Onset  . Anesthesia problems Neg Hx   . Hypotension Neg Hx   . Malignant hyperthermia Neg Hx   . Pseudochol deficiency Neg Hx    Social History  Substance Use Topics  . Smoking status: Never Smoker   . Smokeless tobacco: None  . Alcohol Use: 3.6 oz/week    6 Cans of beer per week   OB History    No data available     Review of Systems  Constitutional: Negative.   Musculoskeletal: Positive for arthralgias. Negative for myalgias, joint swelling and neck pain.  Skin: Negative.   Neurological: Negative.   All other systems reviewed and are negative.   Allergies  Tramadol hcl and Latex  Home Medications   Prior to Admission medications   Medication Sig Start Date End Date Taking? Authorizing Provider  albuterol  (PROVENTIL) (2.5 MG/3ML) 0.083% nebulizer solution Take 2.5 mg by nebulization every 6 (six) hours as needed. wheezing    Historical Provider, MD  Aspirin-Salicylamide-Caffeine (BC FAST PAIN RELIEF) 650-195-33.3 MG PACK Take 1 packet by mouth every 8 (eight) hours.    Historical Provider, MD  cetirizine (ZYRTEC) 10 MG tablet Take 10 mg by mouth daily.    Historical Provider, MD  diclofenac sodium (VOLTAREN) 1 % GEL Apply 1 application topically 4 (four) times daily. 08/10/15   Janne Napoleon, NP  fluticasone (FLONASE) 50 MCG/ACT nasal spray Place 2 sprays into both nostrils 2 (two) times daily. Decrease to 2 sprays/nostril daily after 5 days 11/13/13   Liam Graham, PA-C  ibuprofen (ADVIL,MOTRIN) 800 MG tablet Take 800 mg by mouth every 8 (eight) hours as needed for moderate pain.    Historical Provider, MD  meclizine (ANTIVERT) 50 MG tablet Take 1 tablet (50 mg total) by mouth 2 (two) times daily as needed for dizziness or nausea. 01/27/15   Nicole Pisciotta, PA-C  methocarbamol (ROBAXIN) 500 MG tablet Take 1 tablet (500 mg total) by mouth 2 (two) times daily. 02/11/15   Lacretia Leigh, MD  montelukast (SINGULAIR) 10 MG tablet Take 10 mg by mouth at bedtime.    Historical Provider, MD  predniSONE (DELTASONE) 20 MG tablet Take 3 tabs po on first day, 2 tabs second day, 2 tabs third day, 1  tab fourth day, 1 tab 5th day. Take with food. 08/10/15   Janne Napoleon, NP   Meds Ordered and Administered this Visit  Medications - No data to display  BP 147/84 mmHg  Pulse 80  Temp(Src) 97.3 F (36.3 C) (Oral) No data found.   Physical Exam  Constitutional: She appears well-developed and well-nourished. No distress.  Pulmonary/Chest: Effort normal. No respiratory distress.  Musculoskeletal:  No apparent deformity, swelling or discoloration. There is tenderness along the bony prominences to the medial aspect of the left knee. Flexion is limited to 90 secondary to pain and extension limited to 170 limited due to  pain to the medial aspect of the right knee. There is direct tenderness over the femoral and tibial condyle's as well as the individual tendons. Palpating the squirt-like structures reproduces the same pain for which she presents. No tenderness along the joint line.  Neurological: She is alert. No cranial nerve deficit. She exhibits normal muscle tone.  Skin: Skin is warm and dry.  Psychiatric: She has a normal mood and affect.  Nursing note and vitals reviewed.   ED Course  Procedures (including critical care time)  Labs Review Labs Reviewed - No data to display  Imaging Review No results found.   Visual Acuity Review  Right Eye Distance:   Left Eye Distance:   Bilateral Distance:    Right Eye Near:   Left Eye Near:    Bilateral Near:         MDM   1. Tendinitis of left knee    Retry wearing your knee brace for the next few days. Apply ice to the medial aspect of the knee with the pain is 2-3 times a day. Limit stairclimbing. Prednisone taper dose as directed Diclofenac gel 4 times a day Follow-up with primary care doctor next week as needed.    Janne Napoleon, NP 08/10/15 [phone]

## 2015-08-30 ENCOUNTER — Other Ambulatory Visit: Payer: Self-pay

## 2015-08-30 DIAGNOSIS — Z1231 Encounter for screening mammogram for malignant neoplasm of breast: Secondary | ICD-10-CM

## 2015-09-10 ENCOUNTER — Ambulatory Visit
Admission: RE | Admit: 2015-09-10 | Discharge: 2015-09-10 | Disposition: A | Discharge status: Home / Self Care | Payer: BLUE CROSS/BLUE SHIELD | Source: Ambulatory Visit

## 2015-09-10 DIAGNOSIS — Z1231 Encounter for screening mammogram for malignant neoplasm of breast: Secondary | ICD-10-CM

## 2015-09-11 ENCOUNTER — Other Ambulatory Visit: Payer: Self-pay | Admitting: Internal Medicine

## 2015-09-11 DIAGNOSIS — R928 Other abnormal and inconclusive findings on diagnostic imaging of breast: Secondary | ICD-10-CM

## 2015-09-16 ENCOUNTER — Other Ambulatory Visit: Payer: BLUE CROSS/BLUE SHIELD

## 2015-09-20 ENCOUNTER — Ambulatory Visit
Admission: RE | Admit: 2015-09-20 | Discharge: 2015-09-20 | Disposition: A | Discharge status: Home / Self Care | Payer: BLUE CROSS/BLUE SHIELD | Source: Ambulatory Visit | Attending: Internal Medicine | Admitting: Internal Medicine

## 2015-09-20 DIAGNOSIS — R928 Other abnormal and inconclusive findings on diagnostic imaging of breast: Secondary | ICD-10-CM

## 2016-02-19 ENCOUNTER — Other Ambulatory Visit: Payer: Self-pay | Admitting: Orthopaedic Surgery

## 2016-02-19 DIAGNOSIS — M25562 Pain in left knee: Secondary | ICD-10-CM

## 2016-02-24 ENCOUNTER — Other Ambulatory Visit: Payer: Self-pay | Admitting: Internal Medicine

## 2016-02-24 DIAGNOSIS — N6310 Unspecified lump in the right breast, unspecified quadrant: Secondary | ICD-10-CM

## 2016-02-27 ENCOUNTER — Ambulatory Visit
Admission: RE | Admit: 2016-02-27 | Discharge: 2016-02-27 | Disposition: A | Discharge status: Home / Self Care | Payer: BLUE CROSS/BLUE SHIELD | Source: Ambulatory Visit | Attending: Orthopaedic Surgery | Admitting: Orthopaedic Surgery

## 2016-02-27 DIAGNOSIS — M25562 Pain in left knee: Secondary | ICD-10-CM

## 2016-03-20 ENCOUNTER — Ambulatory Visit
Admission: RE | Admit: 2016-03-20 | Discharge: 2016-03-20 | Disposition: A | Discharge status: Home / Self Care | Payer: BLUE CROSS/BLUE SHIELD | Source: Ambulatory Visit | Attending: Internal Medicine | Admitting: Internal Medicine

## 2016-03-20 DIAGNOSIS — N6310 Unspecified lump in the right breast, unspecified quadrant: Secondary | ICD-10-CM

## 2016-05-18 ENCOUNTER — Ambulatory Visit (INDEPENDENT_AMBULATORY_CARE_PROVIDER_SITE_OTHER): Payer: BLUE CROSS/BLUE SHIELD | Admitting: Orthopaedic Surgery

## 2016-05-18 DIAGNOSIS — S83242D Other tear of medial meniscus, current injury, left knee, subsequent encounter: Secondary | ICD-10-CM

## 2016-06-15 ENCOUNTER — Ambulatory Visit (INDEPENDENT_AMBULATORY_CARE_PROVIDER_SITE_OTHER): Payer: BLUE CROSS/BLUE SHIELD | Admitting: Orthopaedic Surgery

## 2016-06-15 ENCOUNTER — Encounter (INDEPENDENT_AMBULATORY_CARE_PROVIDER_SITE_OTHER): Payer: Self-pay | Admitting: Orthopaedic Surgery

## 2016-06-15 DIAGNOSIS — Z9889 Other specified postprocedural states: Secondary | ICD-10-CM

## 2016-06-15 NOTE — Progress Notes (Signed)
   Office Visit Note   Patient: Teresa Mcmahon           Date of Birth: Feb 12, 1961           MRN: [id number] Visit Date: 06/15/2016              Requested by: Nolene Ebbs, MD [place], [place] [address] PCP: Philis Fendt, MD   Assessment & Plan: Visit Diagnoses: No diagnosis found.  Plan:  - continue PT 2x a week x 4 weeks  - may return to work part time - f/u 4 weeks for recheck knee, possible release at that time  Follow-Up Instructions: Return in about 4 weeks (around 07/13/2016) for recheck left knee.   Orders:  No orders of the defined types were placed in this encounter.  No orders of the defined types were placed in this encounter.     Procedures: No procedures performed   Clinical Data: No additional findings.   Subjective: Chief Complaint  Patient presents with  . Left Knee - Follow-up, Routine Post Op    05/01/16 LEFT KNEE SCOPE DEB/PMM 6 WEEKS 3 DAYS    6 week postop visit for left knee scope and PMM.  Doing well.  Doing PT 3x a week.  No swelling.    Review of Systems   Objective: Vital Signs: There were no vitals taken for this visit.  Physical Exam  Musculoskeletal:       Left knee: She exhibits no effusion.    Left Knee Exam   Range of Motion  Extension: normal  Flexion: normal   Other  Effusion: no effusion present      Specialty Comments:  No specialty comments available.  Imaging: No results found.   PMFS History: Patient Active Problem List   Diagnosis Date Noted  . TENOSYNOVITIS, WRIST 06/03/2010  . WRIST PAIN, LEFT 05/02/2010  . PHARYNGITIS, VIRAL 03/03/2010  . ANEMIA, DEFICIENCY, HX OF 11/11/2009  . URI 05/29/2009  . UNSPECIFIED GLAUCOMA 10/24/2008  . LARYNGITIS, ACUTE 08/27/2008  . ASTHMA 08/27/2008  . 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 ANXIETY DEPRESSION 05/11/2007  . HEADACHE, TENSION 05/11/2007  . GASTROESOPHAGEAL REFLUX DISEASE 05/11/2007   Past Medical History:  Diagnosis Date    . Anemia   . Asthma   . Carpal tunnel syndrome   . GERD (gastroesophageal reflux disease)   . Shortness of breath     Family History  Problem Relation Age of Onset  . Anesthesia problems Neg Hx   . Hypotension Neg Hx   . Malignant hyperthermia Neg Hx   . Pseudochol deficiency Neg Hx     Past Surgical History:  Procedure Laterality Date  . abation    . BACK SURGERY    . COLONOSCOPY  07/20/2011   Procedure: COLONOSCOPY;  Surgeon: Jeryl Columbia, MD;  Location: [facility] ENDOSCOPY;  Service: Endoscopy;  Laterality: N/A;   Social History   Occupational History  . Not on file.   Social History Main Topics  . Smoking status: Never Smoker  . Smokeless tobacco: Not on file  . Alcohol use 3.6 oz/week    6 Cans of beer per week  . Drug use: No  . Sexual activity: Yes

## 2016-07-08 ENCOUNTER — Telehealth: Payer: Self-pay | Admitting: Gastroenterology

## 2016-07-09 ENCOUNTER — Encounter: Payer: Self-pay | Admitting: Gastroenterology

## 2016-07-09 NOTE — Telephone Encounter (Signed)
Dr.Danis has reviewed records and accepted for patient to have a colonoscopy. Patient has been scheduled for colonoscopy.

## 2016-07-13 ENCOUNTER — Ambulatory Visit (INDEPENDENT_AMBULATORY_CARE_PROVIDER_SITE_OTHER): Payer: BLUE CROSS/BLUE SHIELD | Admitting: Orthopaedic Surgery

## 2016-07-13 DIAGNOSIS — Z9889 Other specified postprocedural states: Secondary | ICD-10-CM

## 2016-07-13 NOTE — Progress Notes (Signed)
Patient is a 55 year old female who is approximately 8-10 weeks status post left knee arthroscopy with debridement of the meniscus. She is doing well. She is ready to go back to full duty. She has occasional pain but she is doing home exercises. She is happy overall.  Physical exam of the left knee shows well-healed surgical scars. Full range of motion. She elicits some discomfort in the popliteal fossa.  Plan: At this point patient has reached [place] I think she is ready to go back to full duty. We did discuss a Baker cyst which is about 5 cm and is fairly large but I would wait to see if this resolves on its own now that we've done surgery if it increases in size or becomes more symptomatic we could consider ultrasound-guided aspiration although I had did caution her that this may not be truly symptomatically and reaccumulate. If she needed aspiration I would have her see Dr. Paulla Fore for this.

## 2016-07-14 ENCOUNTER — Other Ambulatory Visit: Payer: Self-pay | Admitting: Internal Medicine

## 2016-07-14 DIAGNOSIS — Z1231 Encounter for screening mammogram for malignant neoplasm of breast: Secondary | ICD-10-CM

## 2016-09-01 ENCOUNTER — Encounter: Payer: Self-pay | Admitting: Gastroenterology

## 2016-09-10 ENCOUNTER — Ambulatory Visit
Admission: RE | Admit: 2016-09-10 | Discharge: 2016-09-10 | Disposition: A | Discharge status: Home / Self Care | Payer: BLUE CROSS/BLUE SHIELD | Source: Ambulatory Visit | Attending: Internal Medicine | Admitting: Internal Medicine

## 2016-09-10 DIAGNOSIS — Z1231 Encounter for screening mammogram for malignant neoplasm of breast: Secondary | ICD-10-CM

## 2016-10-06 NOTE — Telephone Encounter (Signed)
Patient cancelled appointment. Records will be in "records reviewed" folder. °

## 2016-10-07 ENCOUNTER — Encounter: Payer: Self-pay | Admitting: Gastroenterology

## 2017-04-30 ENCOUNTER — Emergency Department (HOSPITAL_COMMUNITY): Payer: Self-pay

## 2017-04-30 ENCOUNTER — Encounter (HOSPITAL_COMMUNITY): Payer: Self-pay | Admitting: *Deleted

## 2017-04-30 ENCOUNTER — Emergency Department (HOSPITAL_COMMUNITY)
Admission: EM | Admit: 2017-04-30 | Discharge: 2017-04-30 | Disposition: A | Discharge status: Home / Self Care | Payer: Self-pay | Attending: Emergency Medicine | Admitting: Emergency Medicine

## 2017-04-30 DIAGNOSIS — Z91040 Latex allergy status: Secondary | ICD-10-CM | POA: Insufficient documentation

## 2017-04-30 DIAGNOSIS — M79641 Pain in right hand: Secondary | ICD-10-CM | POA: Insufficient documentation

## 2017-04-30 DIAGNOSIS — J45909 Unspecified asthma, uncomplicated: Secondary | ICD-10-CM | POA: Insufficient documentation

## 2017-04-30 DIAGNOSIS — Z79899 Other long term (current) drug therapy: Secondary | ICD-10-CM | POA: Insufficient documentation

## 2017-04-30 MED ORDER — NAPROXEN 500 MG PO TABS: 500.0000 mg | ORAL_TABLET | Freq: Two times a day (BID) | ORAL | 0 refills | Status: DC

## 2017-04-30 NOTE — ED Provider Notes (Signed)
[facility] DEPT Provider Note   CSN: [id number] Arrival date & time: 04/30/17  1352     History   Chief Complaint Chief Complaint  Patient presents with  . Hand Pain    HPI Teresa Mcmahon is a 56 y.o. female with a history of carpal tunnel syndrome who presents to the emergency department today for right wrist/hand pain 6 months. The patient notes that she has had pain on the radial aspect of her her wrist into the base of her first digit. This is been a intermittent dull, achy pain that occurs after the patient works. The patient pulls binding from books at work and says that she uses some flexion and abduction of the thumb constantly. 2 days ago the patient was getting up from bed when she placed an excessive amount of force on her nightstand to the right hand and felt a popping sensation at the base of the right thumb. She has had increased pain since then. She has been using Tylenol and arthritis cream for this with relief. She denies any fever, chills, redness, swelling. She admits to numbness and tingling in the median nerve distribution from her carpal tunnel. She has never been seen for this or had surgery for this. She does not wear wrist splints at night. Denies IVDU.   HPI  Past Medical History:  Diagnosis Date  . Anemia   . Asthma   . Carpal tunnel syndrome   . GERD (gastroesophageal reflux disease)   . Shortness of breath     Patient Active Problem List   Diagnosis Date Noted  . Status post arthroscopy of left knee 06/15/2016  . TENOSYNOVITIS, WRIST 06/03/2010  . WRIST PAIN, LEFT 05/02/2010  . PHARYNGITIS, VIRAL 03/03/2010  . ANEMIA, DEFICIENCY, HX OF 11/11/2009  . URI 05/29/2009  . UNSPECIFIED GLAUCOMA 10/24/2008  . LARYNGITIS, ACUTE 08/27/2008  . ASTHMA 08/27/2008  . 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 ANXIETY DEPRESSION 05/11/2007  . HEADACHE, TENSION 05/11/2007  . GASTROESOPHAGEAL REFLUX DISEASE 05/11/2007    Past Surgical History:  Procedure  Laterality Date  . abation    . BACK SURGERY    . COLONOSCOPY  07/20/2011   Procedure: COLONOSCOPY;  Surgeon: Jeryl Columbia, MD;  Location: [facility] ENDOSCOPY;  Service: Endoscopy;  Laterality: N/A;    OB History    No data available       Home Medications    Prior to Admission medications   Medication Sig Start Date End Date Taking? Authorizing Provider  albuterol (PROVENTIL) (2.5 MG/3ML) 0.083% nebulizer solution Take 2.5 mg by nebulization every 6 (six) hours as needed. wheezing    [provider]  Aspirin-Salicylamide-Caffeine (BC FAST PAIN RELIEF) 650-195-33.3 MG PACK Take 1 packet by mouth every 8 (eight) hours.    [provider]  cetirizine (ZYRTEC) 10 MG tablet Take 10 mg by mouth daily.    [provider]  diclofenac sodium (VOLTAREN) 1 % GEL Apply 1 application topically 4 (four) times daily. Patient not taking: Reported on 07/13/2016 08/10/15   Janne Napoleon, NP  fluticasone [facility]) 50 MCG/ACT nasal spray Place 2 sprays into both nostrils 2 (two) times daily. Decrease to 2 sprays/nostril daily after 5 days 11/13/13   Liam Graham, PA-C  ibuprofen (ADVIL,MOTRIN) 800 MG tablet Take 800 mg by mouth every 8 (eight) hours as needed for moderate pain.    [provider]  meclizine (ANTIVERT) 50 MG tablet Take 1 tablet (50 mg total) by mouth 2 (two) times daily as needed  for dizziness or nausea. Patient not taking: Reported on 07/13/2016 01/27/15   Pisciotta, Elmyra Ricks, PA-C  meloxicam (MOBIC) 7.5 MG tablet Take by mouth.    [provider]  methocarbamol (ROBAXIN) 500 MG tablet Take 1 tablet (500 mg total) by mouth 2 (two) times daily. Patient not taking: Reported on 07/13/2016 02/11/15   Lacretia Leigh, MD  montelukast (SINGULAIR) 10 MG tablet Take 10 mg by mouth at bedtime.    [provider]  naproxen (NAPROSYN) 500 MG tablet Take 1 tablet (500 mg total) by mouth 2 (two) times daily. 04/30/17   Keivon Garden, Barth Kirks, PA-C  predniSONE  (DELTASONE) 20 MG tablet Take 3 tabs po on first day, 2 tabs second day, 2 tabs third day, 1 tab fourth day, 1 tab 5th day. Take with food. Patient not taking: Reported on 07/13/2016 08/10/15   Janne Napoleon, NP    Family History Family History  Problem Relation Age of Onset  . Anesthesia problems Neg Hx   . Hypotension Neg Hx   . Malignant hyperthermia Neg Hx   . Pseudochol deficiency Neg Hx     Social History Social History  Substance Use Topics  . Smoking status: Never Smoker  . Smokeless tobacco: Never Used  . Alcohol use 3.6 oz/week    6 Cans of beer per week     Allergies   Tramadol hcl and Latex   Review of Systems Review of Systems  Constitutional: Negative for chills and fever.  Gastrointestinal: Negative for nausea and vomiting.  Musculoskeletal: Positive for arthralgias. Negative for joint swelling.  Skin: Negative for color change and wound.  Neurological: Positive for numbness. Negative for weakness.     Physical Exam Updated Vital Signs BP (!) 167/90 (BP Location: Right Arm)   Pulse 78   Temp 98.2 F (36.8 C) (Oral)   Resp 18   SpO2 100%   Physical Exam  Constitutional: She appears well-developed and well-nourished.  HENT:  Head: Normocephalic and atraumatic.  Right Ear: External ear normal.  Left Ear: External ear normal.  Eyes: Conjunctivae are normal. Right eye exhibits no discharge. Left eye exhibits no discharge. No scleral icterus.  Pulmonary/Chest: Effort normal. No respiratory distress.  Musculoskeletal:  Right hand and wrist: No gross deformities, skin intact. Fingers appear normal. No thenar eminence atrophy. No TTP over flexor sheath. TTP over base of 1st metacarpal. Finger adduction/abduction intact with 5/5 strength.  Thumb opposition intact. Full active and resisted ROM to flexion/extension at wrist, MCP, PIP and DIP of all fingers. FDS/FDP intact. Radial artery 2+ with <2sec cap refill. SILT in M/U/R distributions. Grip intact  bilaterally. Positive Tinel's and Phalen's test. + Finkelstein's test.  Neurological: She is alert.  Skin: No pallor.  Psychiatric: She has a normal mood and affect.  Nursing note and vitals reviewed.    ED Treatments / Results  Labs (all labs ordered are listed, but only abnormal results are displayed) Labs Reviewed - No data to display  EKG  EKG Interpretation None       Radiology Dg Wrist Complete Right  Result Date: 04/30/2017 CLINICAL DATA:  Progressive pain EXAM: RIGHT WRIST - COMPLETE 3+ VIEW COMPARISON:  None. FINDINGS: Frontal, oblique, lateral, and ulnar deviation scaphoid images were obtained. There is no fracture or dislocation. Joint spaces appear normal. No erosive change. IMPRESSION: No fracture or dislocation.  No evident arthropathy. Electronically Signed   By: Lowella Grip III M.D.   On: 04/30/2017 15:24   Dg Hand Complete Right  Result Date: 04/30/2017 CLINICAL DATA:  Progressive pain EXAM: RIGHT HAND - COMPLETE 3+ VIEW COMPARISON:  None. FINDINGS: Frontal, oblique, and lateral views were obtained. There is no appreciable fracture or dislocation. Joint spaces appear normal. There is a small focus calcification in the lateral aspect of the second DIP joint, likely of arthropathic etiology. There is no appreciable joint space narrowing. No erosive change or periostitis. IMPRESSION: Small focus of calcification in the lateral aspect of the second DIP joint, likely of arthropathic etiology. No appreciable joint space narrowing or erosion. No fracture or dislocation. Electronically Signed   By: Lowella Grip III M.D.   On: 04/30/2017 15:23    Procedures Procedures (including critical care time)  Medications Ordered in ED Medications - No data to display   Initial Impression / Assessment and Plan / ED Course  I have reviewed the triage vital signs and the nursing notes.  Pertinent labs & imaging results that were available during my care of the patient  were reviewed by me and considered in my medical decision making (see chart for details).     Patient with several months of pain at the base of the 1st digit of the right hand. This is the patient dominant hand. Pain occurs after full day of work where she is constantly using her hands. PE with no decreased ROM, erythema, swelling or fever to suggest septic joint. Patient with history of carpal tunnel but no thenar eminence atrophy. She does have PE with TTP at the base of the 1st MCP. + Finkelstein's test. Question of de Quervain's tenosynovitis. Patient X-Ray negative for obvious fracture or dislocation. Will treat the patient with thumb spica and have the patient try trial of NSAID's therapy along with rest. If conservative measures do not work patient is to follow up with ortho for further eval and possibly steroid infections, discuss surgery etc. Patient given brace while in ED, conservative therapy recommended and discussed. Patient will be dc home & is agreeable with above plan.  Final Clinical Impressions(s) / ED Diagnoses   Final diagnoses:  Right hand pain    New Prescriptions Discharge Medication List as of 04/30/2017  5:31 PM    START taking these medications   Details  naproxen (NAPROSYN) 500 MG tablet Take 1 tablet (500 mg total) by mouth 2 (two) times daily., Starting Fri 04/30/2017, Print         Felise Georgia, Barth Kirks, PA-C 04/30/17 Guy Franco, MD 05/01/17 1116

## 2017-04-30 NOTE — ED Triage Notes (Signed)
Pt is here with right hand and wrist pain for a while and now pain has increased.

## 2017-04-30 NOTE — Discharge Instructions (Signed)
Please read and follow all provided instructions.  You have been seen today for right wrist/hand pain  Tests performed today include: An x-ray of the affected area - does NOT show any broken bones or dislocations.  Vital signs. See below for your results today.   Home care instructions: -- *PRICE in the first 24-48 hours after injury: Protect (with brace, splint, sling), if given by your provider Rest Ice- Do not apply ice pack directly to your skin, place towel or similar between your skin and ice/ice pack. Apply ice for 20 min, then remove for 40 min while awake Compression- Wear brace, elastic bandage, splint as directed by your provider (wear esp at night when sleeping and while at work) Elevate affected extremity above the level of your heart when not walking around for the first 24-48 hours   Use Naproxen as directed. Take Tylenol if needed for additional pain relief.   Follow-up instructions: Please follow-up with your primary care provider or the provided orthopedic physician (bone specialist) if you continue to have significant pain in 1 week. In this case you may have a more severe injury that requires further care.   Return instructions:  Please return if your toes or feet are numb or tingling, appear gray or blue, or you have severe pain (also elevate the leg and loosen splint or wrap if you were given one) Please return to the Emergency Department if you experience worsening symptoms.  Please return if you have any other emergent concerns. Additional Information:  Your vital signs today were: BP (!) 148/85    Pulse 91    Temp 98 F (36.7 C) (Oral)    Resp 18    SpO2 97%  If your blood pressure (BP) was elevated above 135/85 this visit, please have this repeated by your doctor within one month. ---------------

## 2017-09-02 ENCOUNTER — Other Ambulatory Visit: Payer: Self-pay | Admitting: Internal Medicine

## 2017-09-02 DIAGNOSIS — E2839 Other primary ovarian failure: Secondary | ICD-10-CM

## 2017-09-02 DIAGNOSIS — Z1231 Encounter for screening mammogram for malignant neoplasm of breast: Secondary | ICD-10-CM

## 2017-09-03 ENCOUNTER — Encounter: Payer: Self-pay | Admitting: Gastroenterology

## 2017-09-24 ENCOUNTER — Ambulatory Visit
Admission: RE | Admit: 2017-09-24 | Discharge: 2017-09-24 | Disposition: A | Discharge status: Home / Self Care | Payer: BLUE CROSS/BLUE SHIELD | Source: Ambulatory Visit | Attending: Internal Medicine | Admitting: Internal Medicine

## 2017-09-24 DIAGNOSIS — Z1231 Encounter for screening mammogram for malignant neoplasm of breast: Secondary | ICD-10-CM

## 2017-09-24 DIAGNOSIS — E2839 Other primary ovarian failure: Secondary | ICD-10-CM

## 2017-10-08 DIAGNOSIS — M1811 Unilateral primary osteoarthritis of first carpometacarpal joint, right hand: Secondary | ICD-10-CM | POA: Insufficient documentation

## 2017-10-08 DIAGNOSIS — M189 Osteoarthritis of first carpometacarpal joint, unspecified: Secondary | ICD-10-CM | POA: Insufficient documentation

## 2017-10-26 ENCOUNTER — Ambulatory Visit (AMBULATORY_SURGERY_CENTER): Payer: Self-pay | Admitting: *Deleted

## 2017-10-26 ENCOUNTER — Other Ambulatory Visit: Payer: Self-pay

## 2017-10-26 VITALS — Ht 66.0 in | Wt 190.2 lb

## 2017-10-26 DIAGNOSIS — Z86010 Personal history of colonic polyps: Secondary | ICD-10-CM

## 2017-10-26 MED ORDER — PEG-KCL-NACL-NASULF-NA ASC-C 140 G PO SOLR: 1.0000 | Freq: Once | ORAL | 0 refills | Status: AC

## 2017-10-26 NOTE — Progress Notes (Signed)
No egg or soy allergy  No home oxygen or hx of sleep apnea  No diet medications taken  Registered in [place]  No intubation or anesthesia problems per pt

## 2017-10-27 ENCOUNTER — Encounter: Payer: Self-pay | Admitting: Gastroenterology

## 2017-11-09 ENCOUNTER — Encounter: Payer: Self-pay | Admitting: Gastroenterology

## 2017-11-09 ENCOUNTER — Ambulatory Visit (AMBULATORY_SURGERY_CENTER): Payer: BLUE CROSS/BLUE SHIELD | Admitting: Gastroenterology

## 2017-11-09 VITALS — BP 135/70 | HR 67 | Temp 97.3°F | Resp 12

## 2017-11-09 DIAGNOSIS — D122 Benign neoplasm of ascending colon: Secondary | ICD-10-CM

## 2017-11-09 DIAGNOSIS — Z86010 Personal history of colonic polyps: Secondary | ICD-10-CM

## 2017-11-09 MED ORDER — SODIUM CHLORIDE 0.9 % IV SOLN: 500.0000 mL | Freq: Once | INTRAVENOUS | Status: DC

## 2017-11-09 NOTE — Progress Notes (Signed)
A and O x3. Report to RN. Tolerated MAC anesthesia well.

## 2017-11-09 NOTE — Op Note (Signed)
[place] Patient Name: Teresa Mcmahon Procedure Date: 11/09/2017 9:55 AM MRN: [id number] Endoscopist: Mallie Mussel L. Loletha Carrow , MD Age: 57 Referring MD:  Date of Birth: 02-Jan-1961 Gender: Female Account #: [id number] Procedure:                Colonoscopy Indications:              Surveillance: Personal history of adenomatous                            polyps on last colonoscopy > 5 years ago (TA                            07/2011) Medicines:                Monitored Anesthesia Care Procedure:                Pre-Anesthesia Assessment:                           - Prior to the procedure, a History and Physical                            was performed, and patient medications and                            allergies were reviewed. The patient's tolerance of                            previous anesthesia was also reviewed. The risks                            and benefits of the procedure and the sedation                            options and risks were discussed with the patient.                            All questions were answered, and informed consent                            was obtained. Prior Anticoagulants: The patient has                            taken no previous anticoagulant or antiplatelet                            agents. ASA Grade Assessment: II - A patient with                            mild systemic disease. After reviewing the risks                            and benefits, the patient was deemed in  satisfactory condition to undergo the procedure.                           After obtaining informed consent, the colonoscope                            was passed under direct vision. Throughout the                            procedure, the patient's blood pressure, pulse, and                            oxygen saturations were monitored continuously. The                            Colonoscope was introduced through the anus and                           advanced to the the cecum, identified by                            appendiceal orifice and ileocecal valve. The                            colonoscopy was performed without difficulty. The                            patient tolerated the procedure well. The quality                            of the bowel preparation was excellent. The                            ileocecal valve, appendiceal orifice, and rectum                            were photographed. The quality of the bowel                            preparation was evaluated using the BBPS [facility]                            Bowel Preparation Scale) with scores of: Right                            Colon = 3, Transverse Colon = 3 and Left Colon = 3                            (entire mucosa seen well with no residual staining,                            small fragments of stool or opaque liquid). The  total BBPS score equals 9. The bowel preparation                            used was SUPREP. Scope In: 10:03:31 AM Scope Out: 10:17:10 AM Scope Withdrawal Time: 0 hours 10 minutes 20 seconds  Total Procedure Duration: 0 hours 13 minutes 39 seconds  Findings:                 The perianal and digital rectal examinations were                            normal.                           A 6 mm polyp was found in the ascending colon. The                            polyp was sessile. The polyp was removed with a                            cold snare. Resection and retrieval were complete.                           The exam was otherwise without abnormality on                            direct and retroflexion views. Complications:            No immediate complications. Estimated Blood Loss:     Estimated blood loss was minimal. Impression:               - One 6 mm polyp in the ascending colon, removed                            with a cold snare. Resected and retrieved.                            - The examination was otherwise normal on direct                            and retroflexion views. Recommendation:           - Patient has a contact number available for                            emergencies. The signs and symptoms of potential                            delayed complications were discussed with the                            patient. Return to normal activities tomorrow.                            Written discharge instructions were provided to the  patient.                           - Resume previous diet.                           - Continue present medications.                           - Await pathology results.                           - Repeat colonoscopy is recommended for                            surveillance. The colonoscopy date will be                            determined after pathology results from today's                            exam become available for review. Brand Siever L. Loletha Carrow, MD 11/09/2017 10:24:10 AM This report has been signed electronically.

## 2017-11-09 NOTE — Progress Notes (Signed)
No problems noted in the recovery room. maw 

## 2017-11-09 NOTE — Patient Instructions (Signed)
YOU HAD AN ENDOSCOPIC PROCEDURE TODAY AT THE [facility]:   Refer to the procedure report that was given to you for any specific questions about what was found during the examination.  If the procedure report does not answer your questions, please call your gastroenterologist to clarify.  If you requested that your care partner not be given the details of your procedure findings, then the procedure report has been included in a sealed envelope for you to review at your convenience later.  YOU SHOULD EXPECT: Some feelings of bloating in the abdomen. Passage of more gas than usual.  Walking can help get rid of the air that was put into your GI tract during the procedure and reduce the bloating. If you had a lower endoscopy (such as a colonoscopy or flexible sigmoidoscopy) you may notice spotting of blood in your stool or on the toilet paper. If you underwent a bowel prep for your procedure, you may not have a normal bowel movement for a few days.  Please Note:  You might notice some irritation and congestion in your nose or some drainage.  This is from the oxygen used during your procedure.  There is no need for concern and it should clear up in a day or so.  SYMPTOMS TO REPORT IMMEDIATELY:   Following lower endoscopy (colonoscopy or flexible sigmoidoscopy):  Excessive amounts of blood in the stool  Significant tenderness or worsening of abdominal pains  Swelling of the abdomen that is new, acute  Fever of 100F or higher   For urgent or emergent issues, a gastroenterologist can be reached at any hour by calling [phone].   DIET:  We do recommend a small meal at first, but then you may proceed to your regular diet.  Drink plenty of fluids but you should avoid alcoholic beverages for 24 hours.  ACTIVITY:  You should plan to take it easy for the rest of today and you should NOT DRIVE or use heavy machinery until tomorrow (because of the sedation medicines used during the test).     FOLLOW UP: Our staff will call the number listed on your records the next business day following your procedure to check on you and address any questions or concerns that you may have regarding the information given to you following your procedure. If we do not reach you, we will leave a message.  However, if you are feeling well and you are not experiencing any problems, there is no need to return our call.  We will assume that you have returned to your regular daily activities without incident.  If any biopsies were taken you will be contacted by phone or by letter within the next 1-3 weeks.  Please call us at [phone] if you have not heard about the biopsies in 3 weeks.    SIGNATURES/CONFIDENTIALITY: You and/or your care partner have signed paperwork which will be entered into your electronic medical record.  These signatures attest to the fact that that the information above on your After Visit Summary has been reviewed and is understood.  Full responsibility of the confidentiality of this discharge information lies with you and/or your care-partner.   Handout was given to your care partner on polyps. You may resume your current medications today. Await biopsy results. Please call if any questions or concerns.   

## 2017-11-09 NOTE — Progress Notes (Signed)
Called to room to assist during endoscopic procedure.  Patient ID and intended procedure confirmed with present staff. Received instructions for my participation in the procedure from the performing physician.  

## 2017-11-10 ENCOUNTER — Telehealth: Payer: Self-pay | Admitting: *Deleted

## 2017-11-10 ENCOUNTER — Telehealth: Payer: Self-pay

## 2017-11-10 NOTE — Telephone Encounter (Signed)
  Follow up Call-  Call back number 11/09/2017  Post procedure Call Back phone  # [phone]  Permission to leave phone message Yes  Some recent data might be hidden    LMOM, will call back later today

## 2017-11-10 NOTE — Telephone Encounter (Signed)
Left message

## 2017-11-17 ENCOUNTER — Encounter: Payer: Self-pay | Admitting: Gastroenterology

## 2017-12-23 DIAGNOSIS — M65312 Trigger thumb, left thumb: Secondary | ICD-10-CM | POA: Insufficient documentation

## 2017-12-27 ENCOUNTER — Ambulatory Visit: Payer: BLUE CROSS/BLUE SHIELD | Admitting: Podiatry

## 2017-12-27 ENCOUNTER — Encounter: Payer: Self-pay | Admitting: Podiatry

## 2017-12-27 ENCOUNTER — Other Ambulatory Visit: Payer: Self-pay | Admitting: Podiatry

## 2017-12-27 ENCOUNTER — Ambulatory Visit (INDEPENDENT_AMBULATORY_CARE_PROVIDER_SITE_OTHER): Payer: BLUE CROSS/BLUE SHIELD

## 2017-12-27 VITALS — BP 155/81 | HR 84 | Resp 16

## 2017-12-27 DIAGNOSIS — M7750 Other enthesopathy of unspecified foot: Secondary | ICD-10-CM

## 2017-12-27 DIAGNOSIS — M79671 Pain in right foot: Secondary | ICD-10-CM | POA: Diagnosis not present

## 2017-12-27 DIAGNOSIS — M779 Enthesopathy, unspecified: Secondary | ICD-10-CM

## 2017-12-27 MED ORDER — DICLOFENAC SODIUM 75 MG PO TBEC: 75.0000 mg | DELAYED_RELEASE_TABLET | Freq: Two times a day (BID) | ORAL | 2 refills | Status: DC

## 2017-12-27 MED ORDER — TRIAMCINOLONE ACETONIDE 10 MG/ML IJ SUSP
10.0000 mg | Freq: Once | INTRAMUSCULAR | Status: AC
  Administered 2017-12-27: 10 mg

## 2017-12-27 NOTE — Progress Notes (Signed)
   Subjective:    Patient ID: Teresa Mcmahon, female    DOB: 09/05/1960, 57 y.o.   MRN: [id number]  HPI    Review of Systems  All other systems reviewed and are negative.      Objective:   Physical Exam        Assessment & Plan:

## 2017-12-29 NOTE — Progress Notes (Signed)
Subjective:   Patient ID: Teresa Mcmahon, female   DOB: 57 y.o.   MRN: [id number]   HPI Patient presents stating that she has a very painful knot on the outside of the right foot and does not remember specific injury.  Patient states that it is been present for several months and it makes walking difficult.  Patient does not smoke and likes to be active   Review of Systems  All other systems reviewed and are negative.       Objective:  Physical Exam  Constitutional: She appears well-developed and well-nourished.  Cardiovascular: Intact distal pulses.  Pulmonary/Chest: Effort normal.  Musculoskeletal: Normal range of motion.  Neurological: She is alert.  Skin: Skin is warm.  Nursing note and vitals reviewed.   Neurovascular status found to be intact muscle strength is adequate range of motion within normal limits with patient found to have inflammation pain at the base of the fifth metatarsal right with fluid buildup and no indication of tendon dysfunction.  Patient is noted to be well oriented and has good digital perfusion     Assessment:  Inflammatory tendinitis with fluid buildup around the base of the fifth metatarsal right with no indication of tendon dysfunction     Plan:  H&P x-rays reviewed and today careful sheath injection administered to the base of the fifth metatarsal and into the tendon prior to its insertion into the fifth metatarsal after first explaining risk.  Patient had this done under sterile technique and had fascial brace applied to lift the lateral side of the foot and will begin ice therapy and wider shoes and be seen back in 2 weeks  X-ray indicates slight reactivity around the base of the fifth metatarsal but no indications of fracture or other bony injury

## 2018-01-10 ENCOUNTER — Encounter: Payer: Self-pay | Admitting: Podiatry

## 2018-01-10 ENCOUNTER — Other Ambulatory Visit: Payer: Self-pay

## 2018-01-10 ENCOUNTER — Ambulatory Visit: Payer: BLUE CROSS/BLUE SHIELD | Admitting: Podiatry

## 2018-01-10 DIAGNOSIS — M779 Enthesopathy, unspecified: Secondary | ICD-10-CM | POA: Diagnosis not present

## 2018-02-17 ENCOUNTER — Other Ambulatory Visit: Payer: Self-pay | Admitting: Podiatry

## 2018-03-24 ENCOUNTER — Ambulatory Visit (INDEPENDENT_AMBULATORY_CARE_PROVIDER_SITE_OTHER): Payer: BLUE CROSS/BLUE SHIELD

## 2018-03-24 ENCOUNTER — Encounter: Payer: Self-pay | Admitting: Podiatry

## 2018-03-24 ENCOUNTER — Ambulatory Visit: Payer: BLUE CROSS/BLUE SHIELD | Admitting: Podiatry

## 2018-03-24 DIAGNOSIS — M779 Enthesopathy, unspecified: Secondary | ICD-10-CM

## 2018-03-24 DIAGNOSIS — M7751 Other enthesopathy of right foot: Secondary | ICD-10-CM

## 2018-03-24 MED ORDER — TRIAMCINOLONE ACETONIDE 10 MG/ML IJ SUSP
10.0000 mg | Freq: Once | INTRAMUSCULAR | Status: AC
  Administered 2018-03-24: 10 mg

## 2018-03-24 MED ORDER — METHYLPREDNISOLONE 4 MG PO TBPK: ORAL_TABLET | ORAL | 0 refills | Status: DC

## 2018-03-24 NOTE — Progress Notes (Signed)
Subjective:   Patient ID: Teresa Mcmahon, female   DOB: 57 y.o.   MRN: [id number]   HPI Patient presents stating I developed pain in my right foot and then also I have a nodule on top of the foot which is movable and seems to be moderately tender.  It is more though in the joints themselves than in the area where there is a small nodule   ROS      Objective:  Physical Exam  Neurovascular status intact with patient found to have inflammation pain of the third and fourth metatarsal phalangeal joint right and is found to have a small freely movable nodule in the dorsal lateral aspect of the right foot within subcutaneous tissue measuring approximately 1 cm x 1 cm     Assessment:  Inflammatory capsulitis third and fourth metatarsal phalangeal joint right along with ganglionic cyst formation right     Plan:  H&P discussed both problems separately and today minute focus on the joint.  I went ahead and I injected the third and fourth MPJ after aspiration with 3 mg dexamethasone Kenalog and advised on reduced activity and discussed possible removal of the ganglionic cyst in future  X-ray indicates no signs of calcification or arthritic issue

## 2018-03-31 ENCOUNTER — Encounter: Payer: Self-pay | Admitting: Podiatry

## 2018-03-31 ENCOUNTER — Ambulatory Visit: Payer: BLUE CROSS/BLUE SHIELD | Admitting: Podiatry

## 2018-03-31 DIAGNOSIS — M7752 Other enthesopathy of left foot: Secondary | ICD-10-CM

## 2018-03-31 DIAGNOSIS — M779 Enthesopathy, unspecified: Secondary | ICD-10-CM

## 2018-03-31 DIAGNOSIS — M7751 Other enthesopathy of right foot: Secondary | ICD-10-CM | POA: Diagnosis not present

## 2018-03-31 MED ORDER — DICLOFENAC SODIUM 75 MG PO TBEC: 75.0000 mg | DELAYED_RELEASE_TABLET | Freq: Two times a day (BID) | ORAL | 2 refills | Status: DC

## 2018-04-03 NOTE — Progress Notes (Signed)
Subjective:   Patient ID: Teresa Mcmahon, female   DOB: 57 y.o.   MRN: [id number]   HPI Patient states she still has pain with mild improvement from previous treatment but she still has to walk on cement floor and has a lot of pain in her feet when walking   ROS      Objective:  Physical Exam  Neurovascular status intact with patient found to have forefoot discomfort bilateral with inflammation fluid around the lesser MPJs left over right foot     Assessment:  Chronic capsulitis left and right foot     Plan:  Reviewed condition and difficulty of this along with her work environment.  I have recommended orthotics to try to reduce the stress against the MPJs and patient is scanned in will have a custom type orthotics made and will be seen back when ready and dispensed by the ped orthotist

## 2018-04-21 ENCOUNTER — Ambulatory Visit: Payer: BLUE CROSS/BLUE SHIELD | Admitting: Orthotics

## 2018-04-21 DIAGNOSIS — M779 Enthesopathy, unspecified: Secondary | ICD-10-CM

## 2018-04-21 DIAGNOSIS — M79671 Pain in right foot: Secondary | ICD-10-CM

## 2018-04-21 NOTE — Progress Notes (Signed)
Patient came in today to pick up custom made foot orthotics.  The goals were accomplished and the patient reported no dissatisfaction with said orthotics.  Patient was advised of breakin period and how to report any issues. 

## 2018-05-05 ENCOUNTER — Ambulatory Visit: Payer: BLUE CROSS/BLUE SHIELD | Admitting: Podiatry

## 2018-05-05 ENCOUNTER — Encounter: Payer: Self-pay | Admitting: Podiatry

## 2018-05-05 DIAGNOSIS — M7139 Other bursal cyst, multiple sites: Secondary | ICD-10-CM

## 2018-05-05 DIAGNOSIS — M6749 Ganglion, multiple sites: Secondary | ICD-10-CM

## 2018-05-05 DIAGNOSIS — M6789 Other specified disorders of synovium and tendon, multiple sites: Secondary | ICD-10-CM

## 2018-05-05 NOTE — Progress Notes (Signed)
Subjective:   Patient ID: Teresa Mcmahon, female   DOB: 57 y.o.   MRN: [id number]   HPI Patient states her foot is been feeling really good and her joints are better but she does have a knot on her right midfoot which can be bothersome   ROS      Objective:  Physical Exam  Neurovascular status intact with approximately 1.5 cm mass on the dorsal lateral aspect right foot which appears to be within subcutaneous tissue and is freely movable.  Inflammatory capsulitis improved     Assessment:  Probable ganglionic cyst right that is moderately symptomatic along with improved capsulitis     Plan:  I did a proximal nerve block of the right midfoot and then sterile prep applied to the mass and I drained it and I was able to get out approximately a cc of gelatinous fluid and I then applied sterile dressing with compression.  I explained it could recur but hopefully this will be the end of the mass but it does appear to be ganglionic cyst based on the composition of the fluid

## 2018-05-25 ENCOUNTER — Other Ambulatory Visit: Payer: Self-pay | Admitting: Podiatry

## 2018-06-14 DIAGNOSIS — Z5189 Encounter for other specified aftercare: Secondary | ICD-10-CM

## 2018-06-14 HISTORY — DX: Encounter for other specified aftercare: Z51.89

## 2018-07-06 ENCOUNTER — Encounter: Payer: Self-pay | Admitting: Podiatry

## 2018-07-06 ENCOUNTER — Ambulatory Visit: Payer: BLUE CROSS/BLUE SHIELD | Admitting: Podiatry

## 2018-07-06 ENCOUNTER — Ambulatory Visit (INDEPENDENT_AMBULATORY_CARE_PROVIDER_SITE_OTHER): Payer: BLUE CROSS/BLUE SHIELD

## 2018-07-06 DIAGNOSIS — S9001XA Contusion of right ankle, initial encounter: Secondary | ICD-10-CM

## 2018-07-06 DIAGNOSIS — S8261XA Displaced fracture of lateral malleolus of right fibula, initial encounter for closed fracture: Secondary | ICD-10-CM

## 2018-07-06 DIAGNOSIS — R600 Localized edema: Secondary | ICD-10-CM

## 2018-07-06 MED ORDER — HYDROCODONE-ACETAMINOPHEN 10-325 MG PO TABS: 1.0000 | ORAL_TABLET | Freq: Three times a day (TID) | ORAL | 0 refills | Status: DC | PRN

## 2018-07-06 NOTE — Progress Notes (Signed)
Subjective:   Patient ID: Teresa Mcmahon, female   DOB: 57 y.o.   MRN: [id number]   HPI Patient presents stating she fell off a curb and she twisted her right ankle and heels like she heard a snap.  States she is not able to bear weight on it and occurred yesterday   ROS      Objective:  Physical Exam  Neurovascular status intact with acute discomfort around the lateral malleolus right with inflammation fluid and inability currently to walk on it with quite a bit of edema and negative Homans sign     Assessment:  Fracture of the fibula right most likely with inflammatory change     Plan:  H&P x-rays reviewed and today applied Unna boot to help reduce the swelling short-term and leave it on 3 days and applied air fracture walker to mobilize.  Reappoint 3 weeks to reevaluate and see work status  X-rays indicate there is a probable fracture of the fibula that appears nondisplaced

## 2018-07-27 ENCOUNTER — Ambulatory Visit (INDEPENDENT_AMBULATORY_CARE_PROVIDER_SITE_OTHER): Payer: Self-pay

## 2018-07-27 ENCOUNTER — Ambulatory Visit: Payer: BLUE CROSS/BLUE SHIELD | Admitting: Podiatry

## 2018-07-27 ENCOUNTER — Other Ambulatory Visit: Payer: Self-pay | Admitting: Podiatry

## 2018-07-27 ENCOUNTER — Encounter: Payer: Self-pay | Admitting: Podiatry

## 2018-07-27 DIAGNOSIS — S8261XA Displaced fracture of lateral malleolus of right fibula, initial encounter for closed fracture: Secondary | ICD-10-CM

## 2018-07-27 DIAGNOSIS — M79671 Pain in right foot: Secondary | ICD-10-CM | POA: Diagnosis not present

## 2018-07-30 NOTE — Progress Notes (Signed)
Subjective:   Patient ID: Teresa Mcmahon, female   DOB: 57 y.o.   MRN: [id number]   HPI Patient states it seems to be improving with mild swelling but no new concerns   ROS      Objective:  Physical Exam  Neurovascular status intact with patient's right fibula tender with probable stress fracture that seems to be improving with mild discomfort still noted     Assessment:  What appears to be well-healing fracture of the right distal fibula with no current pathology     Plan:  Advised on continued ice reduced activity and moderate immobilization and gave strict instructions of any increase in symptoms were to occur or swelling she is to reappoint immediately  X-ray was negative for what appears to be any changes with no diastases injury or pathology

## 2018-09-05 ENCOUNTER — Other Ambulatory Visit: Payer: Self-pay | Admitting: Internal Medicine

## 2018-09-05 DIAGNOSIS — Z1231 Encounter for screening mammogram for malignant neoplasm of breast: Secondary | ICD-10-CM

## 2018-10-04 ENCOUNTER — Ambulatory Visit
Admission: RE | Admit: 2018-10-04 | Discharge: 2018-10-04 | Disposition: A | Discharge status: Home / Self Care | Payer: BLUE CROSS/BLUE SHIELD | Source: Ambulatory Visit | Attending: Internal Medicine | Admitting: Internal Medicine

## 2018-10-04 DIAGNOSIS — Z1231 Encounter for screening mammogram for malignant neoplasm of breast: Secondary | ICD-10-CM

## 2019-02-16 ENCOUNTER — Encounter (HOSPITAL_COMMUNITY): Payer: Self-pay | Admitting: Emergency Medicine

## 2019-02-16 ENCOUNTER — Ambulatory Visit (INDEPENDENT_AMBULATORY_CARE_PROVIDER_SITE_OTHER): Payer: BLUE CROSS/BLUE SHIELD

## 2019-02-16 ENCOUNTER — Other Ambulatory Visit: Payer: Self-pay

## 2019-02-16 ENCOUNTER — Ambulatory Visit (HOSPITAL_COMMUNITY)
Admission: EM | Admit: 2019-02-16 | Discharge: 2019-02-16 | Disposition: A | Discharge status: Home / Self Care | Payer: BLUE CROSS/BLUE SHIELD | Attending: Family Medicine | Admitting: Family Medicine

## 2019-02-16 DIAGNOSIS — M25521 Pain in right elbow: Secondary | ICD-10-CM | POA: Diagnosis not present

## 2019-02-16 MED ORDER — PREDNISONE 10 MG (21) PO TBPK: ORAL_TABLET | ORAL | 0 refills | Status: DC

## 2019-02-16 NOTE — Discharge Instructions (Addendum)
Your x-ray was negative for any fractures. Treating you for arthritis with prednisone taper Take the medication as prescribed with food Follow up as needed for continued or worsening symptoms

## 2019-02-16 NOTE — ED Provider Notes (Signed)
[facility]    CSN: [id number] Arrival date & time: 02/16/19  7616     History   Chief Complaint Chief Complaint  Patient presents with  . Arm Pain    HPI Teresa Mcmahon is a 58 y.o. female.   Patient is a 58 year old female with past medical history of allergy, anemia, anxiety, arthritis, carpal tunnel, asthma, GERD, shortness of breath.  She presents today with approximately 2 weeks of pain in the right elbow.  The pain is been getting worse.  There is swelling to the elbow.  The pain radiates up and down the right arm.  Denies any associated numbness or tingling.  Denies any specific injury to the arm.  Patient reports doing repetitive arm movements at work.   ROS per HPI      Past Medical History:  Diagnosis Date  . Allergy   . Anemia   . Anxiety   . Arthritis   . Asthma   . Carpal tunnel syndrome   . GERD (gastroesophageal reflux disease)   . Shortness of breath     Patient Active Problem List   Diagnosis Date Noted  . Aftercare 06/14/2018  . Trigger thumb of left hand 12/23/2017  . Osteoarthritis of carpometacarpal (CMC) joint of thumb 10/08/2017  . Status post arthroscopy of left knee 06/15/2016  . Cervical spondylosis with myelopathy 06/05/2015  . TENOSYNOVITIS, WRIST 06/03/2010  . WRIST PAIN, LEFT 05/02/2010  . PHARYNGITIS, VIRAL 03/03/2010  . ANEMIA, DEFICIENCY, HX OF 11/11/2009  . URI 05/29/2009  . UNSPECIFIED GLAUCOMA 10/24/2008  . LARYNGITIS, ACUTE 08/27/2008  . ASTHMA 08/27/2008  . 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 ANXIETY DEPRESSION 05/11/2007  . HEADACHE, TENSION 05/11/2007  . GASTROESOPHAGEAL REFLUX DISEASE 05/11/2007    Past Surgical History:  Procedure Laterality Date  . abation    . ANTERIOR CRUCIATE LIGAMENT REPAIR Left    2018  . BACK SURGERY    . COLONOSCOPY  07/20/2011   Procedure: COLONOSCOPY;  Surgeon: Jeryl Columbia, MD;  Location: [facility] END[facility];  Service: Endoscopy;  Laterality: N/A;  . COLONOSCOPY    . NECK  SURGERY     x3    OB History   No obstetric history on file.      Home Medications    Prior to Admission medications   Medication Sig Start Date End Date Taking? Authorizing Provider  ibuprofen (ADVIL,MOTRIN) 800 MG tablet Take 800 mg by mouth every 8 (eight) hours as needed for moderate pain.   Yes [provider]  Aspirin-Salicylamide-Caffeine (BC FAST PAIN RELIEF) 650-195-33.3 MG PACK Take 1 packet by mouth every 8 (eight) hours.    [provider]  predniSONE (STERAPRED UNI-PAK 21 TAB) 10 MG (21) TBPK tablet 6 tabs for 1 day, then 5 tabs for 1 das, then 4 tabs for 1 day, then 3 tabs for 1 day, 2 tabs for 1 day, then 1 tab for 1 day 02/16/19   Loura Halt A, NP  albuterol (PROVENTIL) (2.5 MG/3ML) 0.083% nebulizer solution Take 2.5 mg by nebulization every 6 (six) hours as needed. wheezing  02/16/19  [provider]  cetirizine (ZYRTEC) 10 MG tablet Take 10 mg by mouth daily.  02/16/19  [provider]  fluticasone (FLONASE) 50 MCG/ACT nasal spray Place 2 sprays into both nostrils 2 (two) times daily. Decrease to 2 sprays/nostril daily after 5 days 11/13/13 02/16/19  Allena Katz H, PA-C  ipratropium (ATROVENT) 0.03 % nasal spray USE TWO SPRAYS IN EACH NOSTRIL FOUR TIMES DAILY  11/22/17 02/16/19  [provider]  montelukast (SINGULAIR) 10 MG tablet Take 10 mg by mouth at bedtime.  02/16/19  [provider]  traZODone (DESYREL) 100 MG tablet at bedtime.  02/16/19  [provider]    Family History Family History  Problem Relation Age of Onset  . Colon cancer Other   . Anesthesia problems Neg Hx   . Hypotension Neg Hx   . Malignant hyperthermia Neg Hx   . Pseudochol deficiency Neg Hx   . Esophageal cancer Neg Hx   . Rectal cancer Neg Hx   . Stomach cancer Neg Hx     Social History Social History   Tobacco Use  . Smoking status: Never Smoker  . Smokeless tobacco: Never Used  Substance Use Topics  . Alcohol use: Yes     Comment: rarely  . Drug use: No     Allergies   Tramadol hcl and Latex   Review of Systems Review of Systems   Physical Exam Triage Vital Signs ED Triage Vitals  Enc Vitals Group     BP 02/16/19 0842 (!) 147/84     Pulse Rate 02/16/19 0842 85     Resp 02/16/19 0842 18     Temp 02/16/19 0842 98.6 F (37 C)     Temp Source 02/16/19 0842 Oral     SpO2 02/16/19 0842 98 %     Weight --      Height --      Head Circumference --      Peak Flow --      Pain Score 02/16/19 0836 8     Pain Loc --      Pain Edu? --      Excl. in [place]? --    No data found.  Updated Vital Signs BP (!) 147/84 (BP Location: Left Arm)   Pulse 85   Temp 98.6 F (37 C) (Oral)   Resp 18   SpO2 98%   Visual Acuity Right Eye Distance:   Left Eye Distance:   Bilateral Distance:    Right Eye Near:   Left Eye Near:    Bilateral Near:     Physical Exam Vitals signs and nursing note reviewed.  Constitutional:      Appearance: Normal appearance.  HENT:     Head: Normocephalic and atraumatic.     Nose: Nose normal.  Eyes:     Conjunctiva/sclera: Conjunctivae normal.  Neck:     Musculoskeletal: Normal range of motion.  Pulmonary:     Effort: Pulmonary effort is normal.  Musculoskeletal: Normal range of motion.        General: Swelling and tenderness present. No deformity or signs of injury.       Arms:     Comments: Pain and swelling just above the right olecranon. Tender to palpation of olecranon and above Good ROM   Skin:    General: Skin is warm and dry.     Capillary Refill: Capillary refill takes less than 2 seconds.  Neurological:     Mental Status: She is alert.  Psychiatric:        Mood and Affect: Mood normal.      UC Treatments / Results  Labs (all labs ordered are listed, but only abnormal results are displayed) Labs Reviewed - No data to display  EKG   Radiology Dg Elbow Complete Right  Result Date: 02/16/2019 CLINICAL DATA:  Right elbow pain for 2 weeks  without known injury. EXAM: RIGHT ELBOW -  COMPLETE 3+ VIEW COMPARISON:  None. FINDINGS: There is no evidence of fracture, dislocation, or joint effusion. There is no evidence of arthropathy. Mild olecranon spurring is noted. Soft tissues are unremarkable. IMPRESSION: No acute abnormality seen in the right elbow. Electronically Signed   By: Marijo Conception M.D.   On: 02/16/2019 09:10    Procedures Procedures (including critical care time)  Medications Ordered in UC Medications - No data to display  Initial Impression / Assessment and Plan / UC Course  I have reviewed the triage vital signs and the nursing notes.  Pertinent labs & imaging results that were available during my care of the patient were reviewed by me and considered in my medical decision making (see chart for details).     X-ray negative for any acute fracture. Treating for arthritis with prednisone taper Recommended rest, ice, elevate Follow up as needed for continued or worsening symptoms  Final Clinical Impressions(s) / UC Diagnoses   Final diagnoses:  Right elbow pain     Discharge Instructions     Your x-ray was negative for any fractures. Treating you for arthritis with prednisone taper Take the medication as prescribed with food Follow up as needed for continued or worsening symptoms     ED Prescriptions    Medication Sig Dispense Auth. Provider   predniSONE (STERAPRED UNI-PAK 21 TAB) 10 MG (21) TBPK tablet 6 tabs for 1 day, then 5 tabs for 1 das, then 4 tabs for 1 day, then 3 tabs for 1 day, 2 tabs for 1 day, then 1 tab for 1 day 21 tablet Rozanna Box, Ishmael Berkovich A, NP     Controlled Substance Prescriptions [place] Controlled Substance Registry consulted? Not Applicable   Orvan July, NP 02/16/19 1058

## 2019-02-16 NOTE — ED Triage Notes (Signed)
Noticed pain around 6/27.  Since then pain increased and now has a knot at elbow.  No known injury.  Pain radiating from elbow up and down arm from elbow.  Radial pulse 2+ in right arm.  No numbness or tingling and patient is right handed

## 2019-03-09 ENCOUNTER — Other Ambulatory Visit: Payer: Self-pay

## 2019-03-09 ENCOUNTER — Ambulatory Visit (HOSPITAL_COMMUNITY)
Admission: EM | Admit: 2019-03-09 | Discharge: 2019-03-09 | Disposition: A | Discharge status: Home / Self Care | Payer: BLUE CROSS/BLUE SHIELD | Attending: Family Medicine | Admitting: Family Medicine

## 2019-03-09 ENCOUNTER — Encounter (HOSPITAL_COMMUNITY): Payer: Self-pay

## 2019-03-09 DIAGNOSIS — M7701 Medial epicondylitis, right elbow: Secondary | ICD-10-CM | POA: Diagnosis not present

## 2019-03-09 MED ORDER — NAPROXEN 500 MG PO TABS: 500.0000 mg | ORAL_TABLET | Freq: Two times a day (BID) | ORAL | 0 refills | Status: DC

## 2019-03-09 NOTE — ED Provider Notes (Signed)
[facility]    CSN: [id number] Arrival date & time: 03/09/19  0802      History   Chief Complaint Chief Complaint  Patient presents with  . Elbow Pain    HPI Teresa Mcmahon is a 58 y.o. female.   HPI  Patient is here returning for right elbow pain.  She was seen 02/16/2019 for same.  X-rays are negative.  She was given prednisone pack.  She states that that took down the swelling and pain temporarily, but the pain came right back.  She works as a Nurse, mental health.  She uses her hands and arms repetitively.  The pain is much worse after work.  No trauma.  No injury. X-rays are reviewed with patient.  They are negative  Past Medical History:  Diagnosis Date  . Allergy   . Anemia   . Anxiety   . Arthritis   . Asthma   . Carpal tunnel syndrome   . GERD (gastroesophageal reflux disease)   . Shortness of breath     Patient Active Problem List   Diagnosis Date Noted  . Aftercare 06/14/2018  . Trigger thumb of left hand 12/23/2017  . Osteoarthritis of carpometacarpal (CMC) joint of thumb 10/08/2017  . Status post arthroscopy of left knee 06/15/2016  . Cervical spondylosis with myelopathy 06/05/2015  . TENOSYNOVITIS, WRIST 06/03/2010  . WRIST PAIN, LEFT 05/02/2010  . PHARYNGITIS, VIRAL 03/03/2010  . ANEMIA, DEFICIENCY, HX OF 11/11/2009  . URI 05/29/2009  . UNSPECIFIED GLAUCOMA 10/24/2008  . LARYNGITIS, ACUTE 08/27/2008  . ASTHMA 08/27/2008  . 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 ANXIETY DEPRESSION 05/11/2007  . HEADACHE, TENSION 05/11/2007  . GASTROESOPHAGEAL REFLUX DISEASE 05/11/2007    Past Surgical History:  Procedure Laterality Date  . abation    . ANTERIOR CRUCIATE LIGAMENT REPAIR Left    2018  . BACK SURGERY    . COLONOSCOPY  07/20/2011   Procedure: COLONOSCOPY;  Surgeon: Jeryl Columbia, MD;  Location: [facility] ENDOSCOPY;  Service: Endoscopy;  Laterality: N/A;  . COLONOSCOPY    . NECK SURGERY     x3    OB History   No obstetric history on file.       Home Medications    Prior to Admission medications   Medication Sig Start Date End Date Taking? Authorizing Provider  Aspirin-Salicylamide-Caffeine (BC FAST PAIN RELIEF) [phone] MG PACK Take 1 packet by mouth every 8 (eight) hours.    [provider]  naproxen (NAPROSYN) 500 MG tablet Take 1 tablet (500 mg total) by mouth 2 (two) times daily. Take with food 03/09/19   Raylene Everts, MD  albuterol (PROVENTIL) (2.5 MG/3ML) 0.083% nebulizer solution Take 2.5 mg by nebulization every 6 (six) hours as needed. wheezing  02/16/19  [provider]  cetirizine (ZYRTEC) 10 MG tablet Take 10 mg by mouth daily.  02/16/19  [provider]  fluticasone (FLONASE) 50 MCG/ACT nasal spray Place 2 sprays into both nostrils 2 (two) times daily. Decrease to 2 sprays/nostril daily after 5 days 11/13/13 02/16/19  Allena Katz H, PA-C  ipratropium (ATROVENT) 0.03 % nasal spray USE TWO SPRAYS IN EACH NOSTRIL FOUR TIMES DAILY 11/22/17 02/16/19  [provider]  montelukast (SINGULAIR) 10 MG tablet Take 10 mg by mouth at bedtime.  02/16/19  [provider]  traZODone (DESYREL) 100 MG tablet at bedtime.  02/16/19  [provider]    Family History Family History  Problem Relation Age of Onset  . Colon cancer Other   .  Anesthesia problems Neg Hx   . Hypotension Neg Hx   . Malignant hyperthermia Neg Hx   . Pseudochol deficiency Neg Hx   . Esophageal cancer Neg Hx   . Rectal cancer Neg Hx   . Stomach cancer Neg Hx     Social History Social History   Tobacco Use  . Smoking status: Never Smoker  . Smokeless tobacco: Never Used  Substance Use Topics  . Alcohol use: Yes    Comment: rarely  . Drug use: No     Allergies   Tramadol hcl and Latex   Review of Systems Review of Systems  Constitutional: Negative for chills and fever.  HENT: Negative for ear pain and sore throat.   Eyes: Negative for pain and visual disturbance.  Respiratory: Negative for  cough and shortness of breath.   Cardiovascular: Negative for chest pain and palpitations.  Gastrointestinal: Negative for abdominal pain and vomiting.  Genitourinary: Negative for dysuria and hematuria.  Musculoskeletal: Positive for arthralgias. Negative for back pain.  Skin: Negative for color change and rash.  Neurological: Negative for seizures and syncope.  All other systems reviewed and are negative.    Physical Exam Triage Vital Signs ED Triage Vitals  Enc Vitals Group     BP 03/09/19 0815 (!) 155/79     Pulse Rate 03/09/19 0815 78     Resp 03/09/19 0815 16     Temp 03/09/19 0815 97.9 F (36.6 C)     Temp Source 03/09/19 0815 Temporal     SpO2 03/09/19 0815 100 %     Weight --      Height --      Head Circumference --      Peak Flow --      Pain Score 03/09/19 0820 8     Pain Loc --      Pain Edu? --      Excl. in [place]? --    No data found.  Updated Vital Signs BP (!) 155/79 (BP Location: Left Arm)   Pulse 78   Temp 97.9 F (36.6 C) (Temporal)   Resp 16   SpO2 100%   Visual Acuity Right Eye Distance:   Left Eye Distance:   Bilateral Distance:    Right Eye Near:   Left Eye Near:    Bilateral Near:     Physical Exam Constitutional:      General: She is not in acute distress.    Appearance: She is well-developed.  HENT:     Head: Normocephalic and atraumatic.  Eyes:     Conjunctiva/sclera: Conjunctivae normal.     Pupils: Pupils are equal, round, and reactive to light.  Neck:     Musculoskeletal: Normal range of motion.  Cardiovascular:     Rate and Rhythm: Normal rate.  Pulmonary:     Effort: Pulmonary effort is normal. No respiratory distress.  Abdominal:     General: There is no distension.     Palpations: Abdomen is soft.  Musculoskeletal: Normal range of motion.     Comments: Elbow looks normal.  No swelling.  Full range of motion.  There is acute tenderness to palpation of the medial epicondyle and pain with resistance to wrist flexion.   Skin:    General: Skin is warm and dry.  Neurological:     General: No focal deficit present.     Mental Status: She is alert.  Psychiatric:        Mood and Affect: Mood normal.  Behavior: Behavior normal.      UC Treatments / Results  Labs (all labs ordered are listed, but only abnormal results are displayed) Labs Reviewed - No data to display  EKG   Radiology No results found.  Procedures Procedures (including critical care time)  Medications Ordered in UC Medications - No data to display  Initial Impression / Assessment and Plan / UC Course  I have reviewed the triage vital signs and the nursing notes.  Pertinent labs & imaging results that were available during my care of the patient were reviewed by me and considered in my medical decision making (see chart for details).     Discussed medial epicondylitis.  Also called "golfer's elbow".  Recommend ice, anti-inflammatories, reduce use of arm.  Wear strap at work.  If this fails to improve may need injection.  Recommend orthopedic for follow-up Final Clinical Impressions(s) / UC Diagnoses   Final diagnoses:  Medial epicondylitis of elbow, right     Discharge Instructions     Wear elbow strap when using arm Take the naproxen 2 x a day with food Ice to area for 20 min every couple of hours See orthopedic if fail to improve   ED Prescriptions    Medication Sig Dispense Auth. Provider   naproxen (NAPROSYN) 500 MG tablet Take 1 tablet (500 mg total) by mouth 2 (two) times daily. Take with food 60 tablet Raylene Everts, MD     Controlled Substance Prescriptions [place] Controlled Substance Registry consulted? Not Applicable   Raylene Everts, MD 03/09/19 [phone]

## 2019-03-09 NOTE — ED Triage Notes (Signed)
Pt presents with ongoing right elbow pain from 7/9 that she feels is getting worse.

## 2019-03-09 NOTE — Discharge Instructions (Addendum)
Wear elbow strap when using arm Take the naproxen 2 x a day with food Ice to area for 20 min every couple of hours See orthopedic if fail to improve

## 2019-08-15 ENCOUNTER — Encounter (HOSPITAL_COMMUNITY): Payer: Self-pay | Admitting: Emergency Medicine

## 2019-08-15 ENCOUNTER — Other Ambulatory Visit: Payer: Self-pay

## 2019-08-15 ENCOUNTER — Emergency Department (HOSPITAL_COMMUNITY)
Admission: EM | Admit: 2019-08-15 | Discharge: 2019-08-15 | Disposition: A | Discharge status: Home / Self Care | Payer: 59 | Attending: Emergency Medicine | Admitting: Emergency Medicine

## 2019-08-15 DIAGNOSIS — M545 Low back pain: Secondary | ICD-10-CM | POA: Diagnosis not present

## 2019-08-15 DIAGNOSIS — Z5321 Procedure and treatment not carried out due to patient leaving prior to being seen by health care provider: Secondary | ICD-10-CM | POA: Insufficient documentation

## 2019-08-15 NOTE — ED Triage Notes (Signed)
Pt states she slipped on a step over the weekend and fell onto her back. C/o lower back pain since, taking extra strength motrin without relief. Denies numbness, tingling or loss of bowel or bladder.

## 2019-08-15 NOTE — ED Notes (Signed)
Pt did not answer x2 for green

## 2019-08-18 ENCOUNTER — Encounter (HOSPITAL_COMMUNITY): Payer: Self-pay | Admitting: Family Medicine

## 2019-08-18 ENCOUNTER — Ambulatory Visit (HOSPITAL_COMMUNITY)
Admission: EM | Admit: 2019-08-18 | Discharge: 2019-08-18 | Disposition: A | Discharge status: Home / Self Care | Payer: PRIVATE HEALTH INSURANCE | Attending: Family Medicine | Admitting: Family Medicine

## 2019-08-18 ENCOUNTER — Ambulatory Visit (INDEPENDENT_AMBULATORY_CARE_PROVIDER_SITE_OTHER): Payer: PRIVATE HEALTH INSURANCE

## 2019-08-18 DIAGNOSIS — W19XXXA Unspecified fall, initial encounter: Secondary | ICD-10-CM

## 2019-08-18 DIAGNOSIS — M545 Low back pain, unspecified: Secondary | ICD-10-CM

## 2019-08-18 DIAGNOSIS — W2209XA Striking against other stationary object, initial encounter: Secondary | ICD-10-CM

## 2019-08-18 MED ORDER — HYDROCODONE-ACETAMINOPHEN 5-325 MG PO TABS: 1.0000 | ORAL_TABLET | Freq: Four times a day (QID) | ORAL | 0 refills | Status: DC | PRN

## 2019-08-18 MED ORDER — METHYLPREDNISOLONE ACETATE 80 MG/ML IJ SUSP: 80.0000 mg | Freq: Once | INTRAMUSCULAR | Status: DC

## 2019-08-18 MED ORDER — PREDNISONE 20 MG PO TABS: ORAL_TABLET | ORAL | 0 refills | Status: DC

## 2019-08-18 NOTE — ED Triage Notes (Signed)
Pt reports she slipped and fell in the mud 6 days ago. Pt states the back pain is worse now than couple days ago. Pt is taking ibuprofen and using hot and cold compress without relief. Pt denies any numbness and tingling.

## 2019-08-18 NOTE — ED Provider Notes (Signed)
[place]    CSN: [id number] Arrival date & time: 08/18/19  [id number]      History   Chief Complaint Chief Complaint  Patient presents with  . Fall  . Back Pain    HPI Teresa Mcmahon is a 59 y.o. female.   59 yo established [place] patient with back pain after falling.  She fell forward at first and then backwards onto her lower lumbar region striking the base of a tree.  She has had pain all week long in her lower lumbar and upper sacral area which radiates bilaterally to her pubic area.  She has tried ibuprofen, muscle relaxers, ice, heat, and a back brace with no relief.  She feels like she is getting worse.  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  She is having no trouble with urination.  She has not had a bowel movement, however, in 3 days.  She denies any numbness or weakness in her lower extremities and is able to straighten her legs without pain.     Past Medical History:  Diagnosis Date  . Allergy   . Anemia   . Anxiety   . Arthritis   . Asthma   . Carpal tunnel syndrome   . GERD (gastroesophageal reflux disease)   . Shortness of breath     Patient Active Problem List   Diagnosis Date Noted  . Aftercare 06/14/2018  . Trigger thumb of left hand 12/23/2017  . Osteoarthritis of carpometacarpal (CMC) joint of thumb 10/08/2017  . Status post arthroscopy of left knee 06/15/2016  . Cervical spondylosis with myelopathy 06/05/2015  . TENOSYNOVITIS, WRIST 06/03/2010  . WRIST PAIN, LEFT 05/02/2010  . PHARYNGITIS, VIRAL 03/03/2010  . ANEMIA, DEFICIENCY, HX OF 11/11/2009  . URI 05/29/2009  . UNSPECIFIED GLAUCOMA 10/24/2008  . LARYNGITIS, ACUTE 08/27/2008  . ASTHMA 08/27/2008  . 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 ANXIETY DEPRESSION 05/11/2007  . HEADACHE, TENSION 05/11/2007  . GASTROESOPHAGEAL REFLUX DISEASE 05/11/2007    Past Surgical History:  Procedure Laterality Date  . abation    . ANTERIOR CRUCIATE LIGAMENT REPAIR Left    2018  . BACK SURGERY    . COLONOSCOPY  07/20/2011   Procedure: COLONOSCOPY;  Surgeon: Jeryl Columbia, MD;  Location: [facility] ENDOSCOPY;  Service: Endoscopy;  Laterality: N/A;  . COLONOSCOPY    . NECK SURGERY     x3    OB History   No obstetric history on file.      Home Medications    Prior to Admission medications   Medication Sig Start Date End Date Taking? Authorizing Provider  Ibuprofen (MOTRIN PO) Take by mouth.   Yes [provider]  HYDROcodone-acetaminophen (NORCO) 5-325 MG tablet Take 1 tablet by mouth every 6 (six) hours as needed for moderate pain. 08/18/19   Robyn Haber, MD  predniSONE (DELTASONE) 20 MG tablet Two daily with food 08/18/19   Robyn Haber, MD  albuterol (PROVENTIL) (2.5 MG/3ML) 0.083% nebulizer solution Take 2.5 mg by nebulization every 6 (six) hours as needed. wheezing  02/16/19  [provider]  cetirizine (ZYRTEC) 10 MG tablet Take 10 mg by mouth daily.  02/16/19  [provider]  fluticasone (FLONASE) 50 MCG/ACT nasal spray Place 2 sprays into both nostrils 2 (two) times daily. Decrease to 2 sprays/nostril daily after 5 days 11/13/13 02/16/19  Allena Katz H, PA-C  ipratropium (ATROVENT) 0.03 % nasal spray USE TWO SPRAYS IN [facility] NOSTRIL FOUR TIMES DAILY 11/22/17 02/16/19  [provider]  montelukast (SINGULAIR) 10 MG tablet Take 10 mg by mouth at bedtime.  02/16/19  [provider]  traZODone (DESYREL) 100 MG tablet at bedtime.  02/16/19  [provider]    Family History Family History  Problem Relation Age of Onset  . Colon cancer Other   . Anesthesia problems Neg Hx   . Hypotension Neg Hx   . Malignant hyperthermia Neg Hx   . Pseudochol deficiency Neg Hx   . Esophageal cancer Neg Hx   . Rectal cancer Neg Hx   . Stomach cancer Neg Hx     Social History Social History   Tobacco Use  . Smoking status: Never Smoker  . Smokeless tobacco: Never Used  Substance Use Topics  . Alcohol use: Yes     Comment: rarely  . Drug use: No     Allergies   Tramadol hcl and Latex   Review of Systems Review of Systems  Gastrointestinal: Positive for constipation.  Genitourinary: Negative.   Musculoskeletal: Positive for back pain.  All other systems reviewed and are negative.    Physical Exam Triage Vital Signs ED Triage Vitals  Enc Vitals Group     BP      Pulse      Resp      Temp      Temp src      SpO2      Weight      Height      Head Circumference      Peak Flow      Pain Score      Pain Loc      Pain Edu?      Excl. in [place]?    No data found.  Updated Vital Signs BP (!) 174/85 (BP Location: Right Arm)   Pulse 83   Temp 98.9 F (37.2 C) (Oral)   Resp 18   SpO2 100%   Physical Exam Vitals and nursing note reviewed.  Constitutional:      General: She is in acute distress.     Appearance: Normal appearance. She is normal weight. She is not ill-appearing or toxic-appearing.  HENT:     Head: Normocephalic.  Eyes:     Conjunctiva/sclera: Conjunctivae normal.  Cardiovascular:     Rate and Rhythm: Normal rate.  Pulmonary:     Effort: Pulmonary effort is normal.  Abdominal:     Palpations: Abdomen is soft.  Musculoskeletal:        General: Tenderness present.     Cervical back: Normal range of motion and neck supple.     Comments: Patient is sitting apprehensively in a chair is likely in the room.  She is able to straighten her legs completely bilaterally.  She has no numbness in her legs.  She has good strength in both of her legs.  Patient has no tenderness in the ribs.  Her abdomen is nontender.  Patient is tender in the upper sacrum and lower lumbar regions midline.  Skin:    General: Skin is warm and dry.  Neurological:     General: No focal deficit present.     Mental Status: She is alert and oriented to person, place, and time.     Motor: No weakness.     Coordination: Coordination normal.  Psychiatric:        Mood and Affect: Mood normal.          Behavior: Behavior normal.  Thought Content: Thought content normal.        Judgment: Judgment normal.      UC Treatments / Results  Labs (all labs ordered are listed, but only abnormal results are displayed) Labs Reviewed - No data to display  EKG   Radiology Pelvis and L-S spine:  No fx.  Minimal scoliosis.  Slight L4-5 narrowing  Procedures Procedures (including critical care time)  Medications Ordered in UC Medications - No data to display  Initial Impression / Assessment and Plan / UC Course  I have reviewed the triage vital signs and the nursing notes.  Pertinent labs & imaging results that were available during my care of the patient were reviewed by me and considered in my medical decision making (see chart for details).    Final Clinical Impressions(s) / UC Diagnoses   Final diagnoses:  Acute bilateral low back pain without sciatica  Fall, initial encounter   Discharge Instructions   None    ED Prescriptions    Medication Sig Dispense Auth. Provider   HYDROcodone-acetaminophen (NORCO) 5-325 MG tablet Take 1 tablet by mouth every 6 (six) hours as needed for moderate pain. 20 tablet Robyn Haber, MD   predniSONE (DELTASONE) 20 MG tablet Two daily with food 10 tablet Robyn Haber, MD     I have reviewed the PDMP during this encounter.   Robyn Haber, MD 08/18/19 [phone]

## 2019-09-04 IMAGING — DX DG WRIST COMPLETE 3+V*R*
4 series · 4 of 4 positions shown · non-contrast
Comparison: None.

CLINICAL DATA: Progressive pain

EXAM:
RIGHT WRIST - COMPLETE 3+ VIEW

[wrist pa]
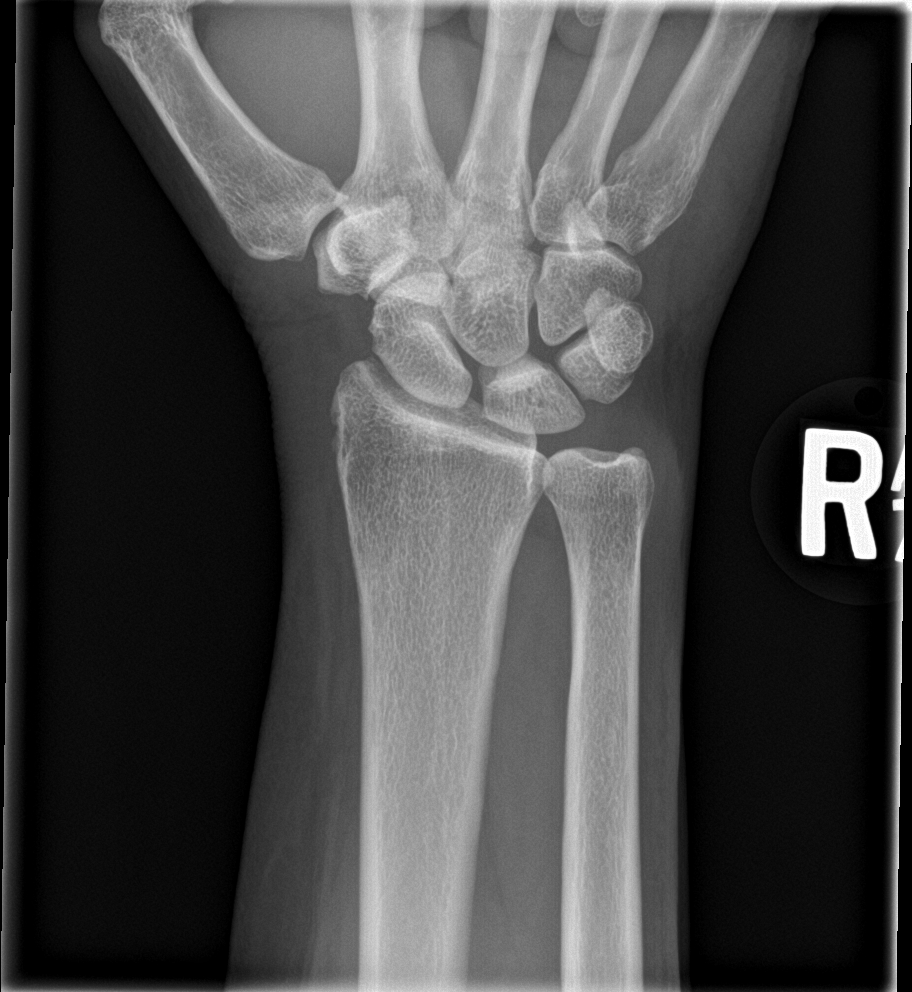

[wrist obl]
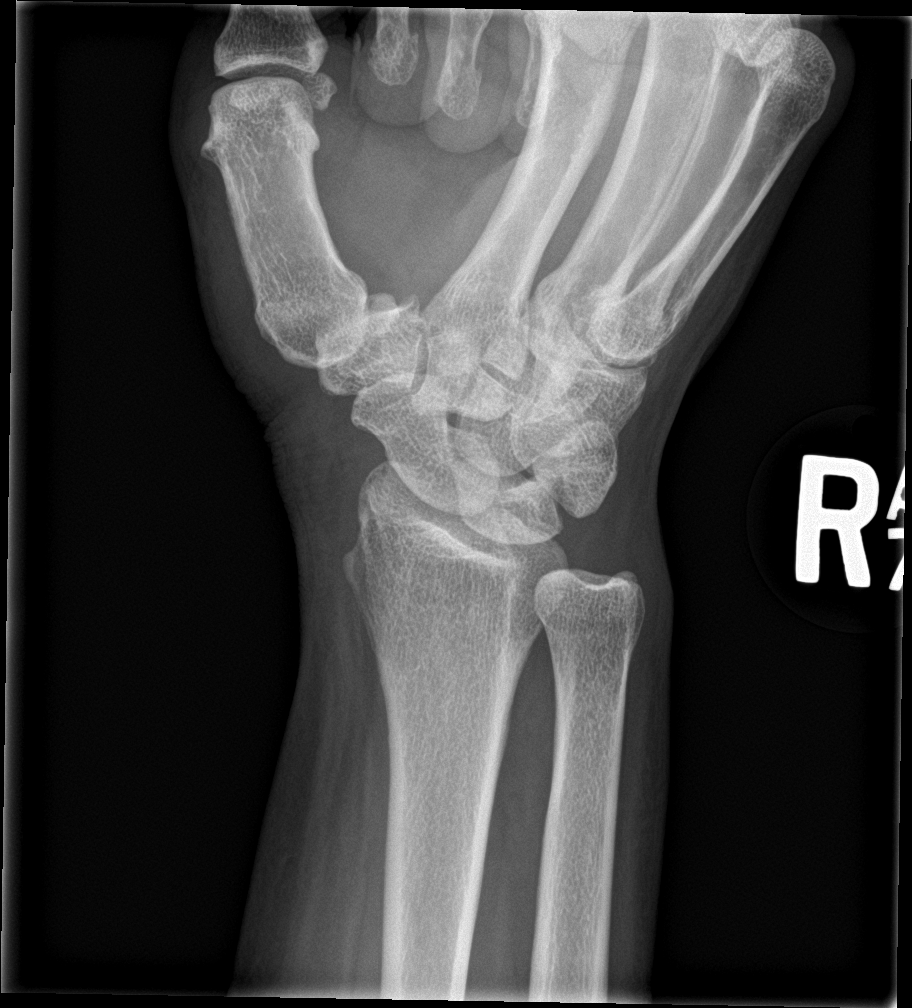

[wrist lat]
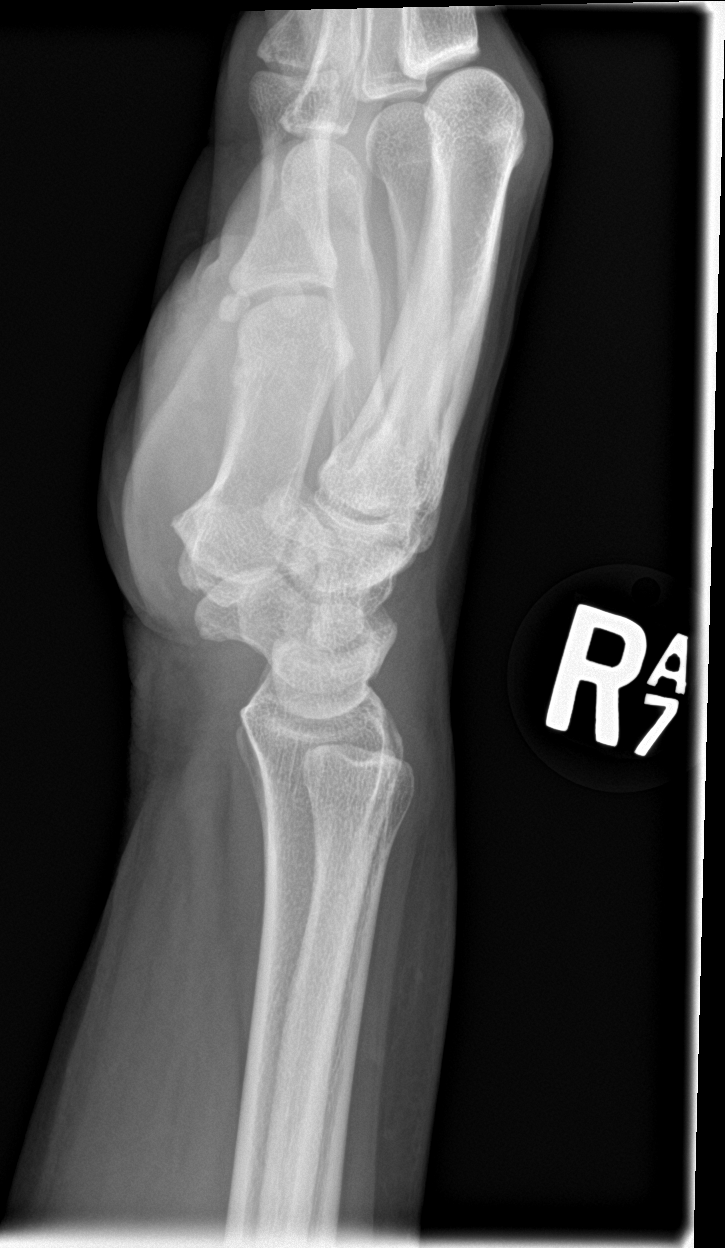

[wrist navicular]
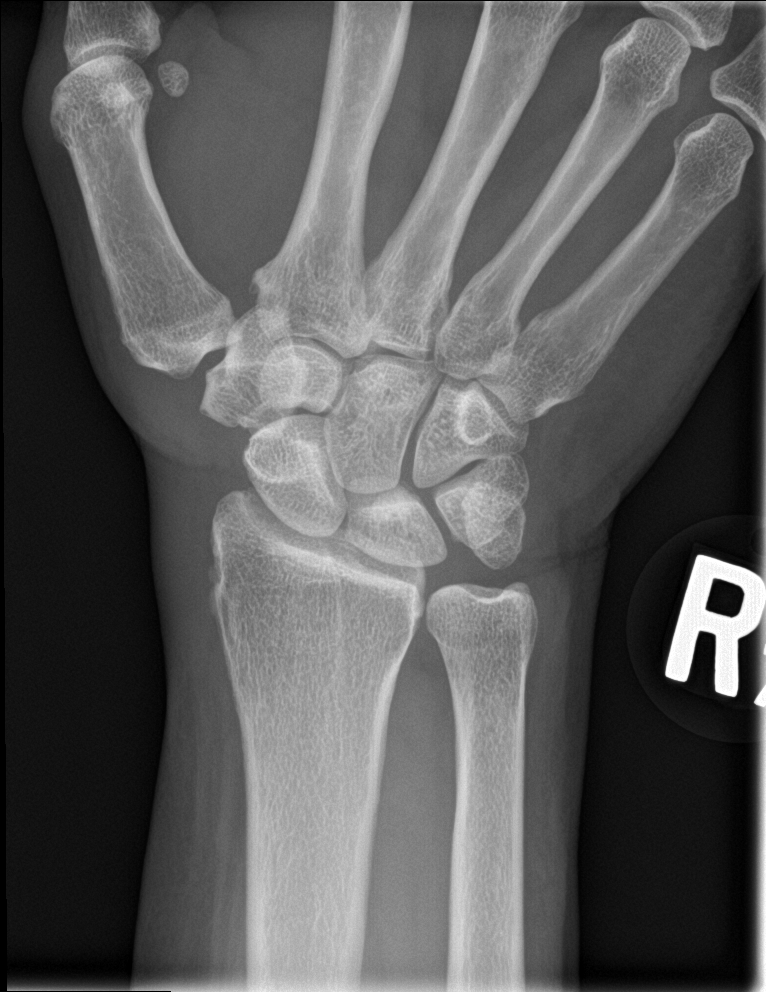

[4 of 4 positions shown; findings below may reference images not displayed]

FINDINGS: Frontal, oblique, lateral, and ulnar deviation scaphoid images were
obtained. There is no fracture or dislocation. Joint spaces appear
normal. No erosive change.
IMPRESSION: No fracture or dislocation.  No evident arthropathy.

## 2019-10-12 ENCOUNTER — Encounter: Payer: Self-pay | Admitting: Podiatry

## 2019-10-12 ENCOUNTER — Other Ambulatory Visit: Payer: Self-pay

## 2019-10-12 ENCOUNTER — Ambulatory Visit (INDEPENDENT_AMBULATORY_CARE_PROVIDER_SITE_OTHER): Payer: 59 | Admitting: Podiatry

## 2019-10-12 ENCOUNTER — Ambulatory Visit (INDEPENDENT_AMBULATORY_CARE_PROVIDER_SITE_OTHER): Payer: 59

## 2019-10-12 DIAGNOSIS — M7751 Other enthesopathy of right foot: Secondary | ICD-10-CM | POA: Diagnosis not present

## 2019-10-12 DIAGNOSIS — M779 Enthesopathy, unspecified: Secondary | ICD-10-CM

## 2019-10-12 DIAGNOSIS — M7671 Peroneal tendinitis, right leg: Secondary | ICD-10-CM

## 2019-10-13 NOTE — Progress Notes (Signed)
Subjective:   Patient ID: Teresa Mcmahon, female   DOB: 59 y.o.   MRN: [id number]   HPI Patient presents stating she is developed a lot of pain in the outside of her right foot and she does not remember an injury but did have pain in this 2 years ago.  States that it is been bothering her does not hurt in the ankle and this seems to be a new area   ROS      Objective:  Physical Exam  Neurovascular status intact with quite a bit of discomfort base of fifth metatarsal right with inflammation fluid in this area with no indications of ankle pain or other pathology currently     Assessment:  Appears to be acute peroneal tendinitis right     Plan:  H&P x-rays reviewed sterile prep done and injected the base of fifth metatarsal at insertion 3 mg Dexasone Kenalog 5 mg Xylocaine advised on reduced activity and reappoint as symptoms indicate  X-rays indicate there is no signs of fracture or bone pathology around this area appears to be soft tissue

## 2020-08-13 ENCOUNTER — Ambulatory Visit: Payer: PRIVATE HEALTH INSURANCE | Admitting: Obstetrics & Gynecology

## 2020-09-12 ENCOUNTER — Encounter: Payer: Self-pay | Admitting: Obstetrics & Gynecology

## 2020-09-12 ENCOUNTER — Other Ambulatory Visit: Payer: Self-pay

## 2020-09-12 ENCOUNTER — Ambulatory Visit (INDEPENDENT_AMBULATORY_CARE_PROVIDER_SITE_OTHER): Payer: 59 | Admitting: Obstetrics & Gynecology

## 2020-09-12 ENCOUNTER — Other Ambulatory Visit (HOSPITAL_COMMUNITY)
Admission: RE | Admit: 2020-09-12 | Discharge: 2020-09-12 | Disposition: A | Discharge status: Home / Self Care | Payer: 59 | Source: Ambulatory Visit | Attending: Obstetrics & Gynecology | Admitting: Obstetrics & Gynecology

## 2020-09-12 VITALS — BP 175/72 | Ht 66.0 in | Wt 177.4 lb

## 2020-09-12 DIAGNOSIS — Z01419 Encounter for gynecological examination (general) (routine) without abnormal findings: Secondary | ICD-10-CM | POA: Diagnosis not present

## 2020-09-12 DIAGNOSIS — R1032 Left lower quadrant pain: Secondary | ICD-10-CM

## 2020-09-12 DIAGNOSIS — Z1231 Encounter for screening mammogram for malignant neoplasm of breast: Secondary | ICD-10-CM | POA: Diagnosis not present

## 2020-09-12 NOTE — Progress Notes (Signed)
GYNECOLOGY ANNUAL PREVENTATIVE CARE ENCOUNTER NOTE  History:     Teresa Mcmahon is a 60 y.o. PMP female here for a routine annual gynecologic exam and to establish care.  She had an endometrial ablation in 2006 and has gone through menopause.Current complaints: LLQ pain that comes and goes. Exacerbated by certain exercises. Last episode was yesterday.  Has been ongoing for years.  Denies abnormal vaginal bleeding, discharge,urinary issues, problems with intercourse or other gynecologic concerns.    Gynecologic History No LMP recorded (lmp unknown). Patient has had an ablation. Last Pap: 2-3 years ago. Results were: normal with negative HPV Last mammogram: 09/24/2017. Results were: normal   Past Medical History:  Diagnosis Date  . Allergy   . Anemia   . Anxiety   . Arthritis   . Asthma   . Carpal tunnel syndrome   . GERD (gastroesophageal reflux disease)   . Shortness of breath     Past Surgical History:  Procedure Laterality Date  . ANTERIOR CRUCIATE LIGAMENT REPAIR Left    2018  . BACK SURGERY    . COLONOSCOPY  07/20/2011   Procedure: COLONOSCOPY;  Surgeon: Teresa Columbia, MD;  Location: [facility] ENDOSCOPY;  Service: Endoscopy;  Laterality: N/A;  . ENDOMETRIAL ABLATION  2006  . NECK SURGERY     x3    Current Outpatient Medications on File Prior to Visit  Medication Sig Dispense Refill  . albuterol (VENTOLIN HFA) 108 (90 Base) MCG/ACT inhaler ProAir HFA 90 mcg/actuation aerosol inhaler    . amitriptyline (ELAVIL) 25 MG tablet Take 25 mg by mouth at bedtime.    . Ibuprofen (MOTRIN PO) Take by mouth.    Marland Kitchen ipratropium (ATROVENT) 0.06 % nasal spray Place 2 sprays into both nostrils 4 (four) times daily.    . meloxicam (MOBIC) 15 MG tablet Take 15 mg by mouth daily.    Marland Kitchen HYDROcodone-acetaminophen (NORCO) 5-325 MG tablet Take 1 tablet by mouth every 6 (six) hours as needed for moderate pain. (Patient not taking: Reported on 09/12/2020) 20 tablet 0  . predniSONE (DELTASONE) 20  MG tablet Two daily with food (Patient not taking: Reported on 09/12/2020) 10 tablet 0  . [DISCONTINUED] cetirizine (ZYRTEC) 10 MG tablet Take 10 mg by mouth daily.    . [DISCONTINUED] fluticasone (FLONASE) 50 MCG/ACT nasal spray Place 2 sprays into both nostrils 2 (two) times daily. Decrease to 2 sprays/nostril daily after 5 days 16 g 2  . [DISCONTINUED] montelukast (SINGULAIR) 10 MG tablet Take 10 mg by mouth at bedtime.    . [DISCONTINUED] traZODone (DESYREL) 100 MG tablet at bedtime.     No current facility-administered medications on file prior to visit.    Allergies  Allergen Reactions  . Tramadol Hcl Itching and Nausea Only  . Latex Itching and Rash    Social History:  reports that she has never smoked. She has never used smokeless tobacco. She reports current alcohol use. She reports that she does not use drugs.  Family History  Problem Relation Age of Onset  . Colon cancer Other   . Anesthesia problems Neg Hx   . Hypotension Neg Hx   . Malignant hyperthermia Neg Hx   . Pseudochol deficiency Neg Hx   . Esophageal cancer Neg Hx   . Rectal cancer Neg Hx   . Stomach cancer Neg Hx     The following portions of the patient's history were reviewed and updated as appropriate: allergies, current medications, past family history, past medical history,  past social history, past surgical history and problem list.  Review of Systems Pertinent items noted in HPI and remainder of comprehensive ROS otherwise negative.  Physical Exam:  Ht 5\' 6"  (1.676 m)   Wt 177 lb 6.4 oz (80.5 kg)   LMP  (LMP Unknown)   BMI 28.63 kg/m  CONSTITUTIONAL: Well-developed, well-nourished female in no acute distress.  HENT:  Normocephalic, atraumatic, External right and left ear normal.  EYES: Conjunctivae and EOM are normal. Pupils are equal, round, and reactive to light. No scleral icterus.  NECK: Normal range of motion, supple, no masses.  Normal thyroid.  SKIN: Skin is warm and dry. No rash noted.  Not diaphoretic. No erythema. No pallor. MUSCULOSKELETAL: Normal range of motion. No tenderness.  No cyanosis, clubbing, or edema.   NEUROLOGIC: Alert and oriented to person, place, and time. Normal reflexes, muscle tone coordination.  PSYCHIATRIC: Normal mood and affect. Normal behavior. Normal judgment and thought content. CARDIOVASCULAR: Normal heart rate noted, regular rhythm RESPIRATORY: Clear to auscultation bilaterally. Effort and breath sounds normal, no problems with respiration noted. BREASTS: Symmetric in size. No masses, tenderness, skin changes, nipple drainage, or lymphadenopathy bilaterally. Performed in the presence of a chaperone. ABDOMEN: Soft, no distention noted.  No tenderness, rebound or guarding.  PELVIC: Normal appearing external genitalia and urethral meatus; atrophic appearing vaginal mucosa and cervix.  No abnormal discharge noted.  Pap smear obtained.  Normal uterine size, no other palpable masses, no uterine or adnexal tenderness.  Performed in the presence of a chaperone.   Assessment and Plan:      1. LLQ pain Possibly musculoskeletal but will rule out GYN etiology. Will follow up results of ultrasound and manage accordingly. - US PELVIC COMPLETE WITH TRANSVAGINAL; Future  2. Breast cancer screening by mammogram Mammogram scheduled - MM 3D SCREEN BREAST BILATERAL; Future  3. Well woman exam with routine gynecological exam - Cytology - PAP Will follow up results of pap smear and manage accordingly. Routine preventative health maintenance measures emphasized, will follow up with PCP. Please refer to After Visit Summary for other counseling recommendations.      Verita Schneiders, MD, [place] for [employer], [place]

## 2020-09-12 NOTE — Patient Instructions (Signed)
Preventive Care 84-62 Years Old, Female Preventive care refers to lifestyle choices and visits with your health care provider that can promote health and wellness. This includes:  A yearly physical exam. This is also called an annual wellness visit.  Regular dental and eye exams.  Immunizations.  Screening for certain conditions.  Healthy lifestyle choices, such as: ? Eating a healthy diet. ? Getting regular exercise. ? Not using drugs or products that contain nicotine and tobacco. ? Limiting alcohol use. What can I expect for my preventive care visit? Physical exam Your health care provider will check your:  Height and weight. These may be used to calculate your BMI (body mass index). BMI is a measurement that tells if you are at a healthy weight.  Heart rate and blood pressure.  Body temperature.  Skin for abnormal spots. Counseling Your health care provider may ask you questions about your:  Past medical problems.  Family's medical history.  Alcohol, tobacco, and drug use.  Emotional well-being.  Home life and relationship well-being.  Sexual activity.  Diet, exercise, and sleep habits.  Work and work Statistician.  Access to firearms.  Method of birth control.  Menstrual cycle.  Pregnancy history. What immunizations do I need? Vaccines are usually given at various ages, according to a schedule. Your health care provider will recommend vaccines for you based on your age, medical history, and lifestyle or other factors, such as travel or where you work.   What tests do I need? Blood tests  Lipid and cholesterol levels. These may be checked every 5 years, or more often if you are over 3 years old.  Hepatitis C test.  Hepatitis B test. Screening  Lung cancer screening. You may have this screening every year starting at age 73 if you have a 30-pack-year history of smoking and currently smoke or have quit within the past 15 years.  Colorectal cancer  screening. ? All adults should have this screening starting at age 52 and continuing until age 17. ? Your health care provider may recommend screening at age 49 if you are at increased risk. ? You will have tests every 1-10 years, depending on your results and the type of screening test.  Diabetes screening. ? This is done by checking your blood sugar (glucose) after you have not eaten for a while (fasting). ? You may have this done every 1-3 years.  Mammogram. ? This may be done every 1-2 years. ? Talk with your health care provider about when you should start having regular mammograms. This may depend on whether you have a family history of breast cancer.  BRCA-related cancer screening. This may be done if you have a family history of breast, ovarian, tubal, or peritoneal cancers.  Pelvic exam and Pap test. ? This may be done every 3 years starting at age 10. ? Starting at age 11, this may be done every 5 years if you have a Pap test in combination with an HPV test. Other tests  STD (sexually transmitted disease) testing, if you are at risk.  Bone density scan. This is done to screen for osteoporosis. You may have this scan if you are at high risk for osteoporosis. Talk with your health care provider about your test results, treatment options, and if necessary, the need for more tests. Follow these instructions at home: Eating and drinking  Eat a diet that includes fresh fruits and vegetables, whole grains, lean protein, and low-fat dairy products.  Take vitamin and mineral supplements  as recommended by your health care provider.  Do not drink alcohol if: ? Your health care provider tells you not to drink. ? You are pregnant, may be pregnant, or are planning to become pregnant.  If you drink alcohol: ? Limit how much you have to 0-1 drink a day. ? Be aware of how much alcohol is in your drink. In the [place] one drink equals one 12 oz bottle of beer (355 mL), one 5 oz glass of  wine (148 mL), or one 1 oz glass of hard liquor (44 mL).   Lifestyle  Take daily care of your teeth and gums. Brush your teeth every morning and night with fluoride toothpaste. Floss one time each day.  Stay active. Exercise for at least 30 minutes 5 or more days each week.  Do not use any products that contain nicotine or tobacco, such as cigarettes, e-cigarettes, and chewing tobacco. If you need help quitting, ask your health care provider.  Do not use drugs.  If you are sexually active, practice safe sex. Use a condom or other form of protection to prevent STIs (sexually transmitted infections).  If you do not wish to become pregnant, use a form of birth control. If you plan to become pregnant, see your health care provider for a prepregnancy visit.  If told by your health care provider, take low-dose aspirin daily starting at age 50.  Find healthy ways to cope with stress, such as: ? Meditation, yoga, or listening to music. ? Journaling. ? Talking to a trusted person. ? Spending time with friends and family. Safety  Always wear your seat belt while driving or riding in a vehicle.  Do not drive: ? If you have been drinking alcohol. Do not ride with someone who has been drinking. ? When you are tired or distracted. ? While texting.  Wear a helmet and other protective equipment during sports activities.  If you have firearms in your house, make sure you follow all gun safety procedures. What's next?  Visit your health care provider once a year for an annual wellness visit.  Ask your health care provider how often you should have your eyes and teeth checked.  Stay up to date on all vaccines. This information is not intended to replace advice given to you by your health care provider. Make sure you discuss any questions you have with your health care provider. Document Revised: 04/30/2020 Document Reviewed: 04/07/2018 Elsevier Patient Education  2021 Elsevier Inc.  

## 2020-09-13 LAB — CYTOLOGY - PAP
Chlamydia: NEGATIVE
Comment: NEGATIVE
Comment: NEGATIVE
Comment: NEGATIVE
Comment: NORMAL
Diagnosis: NEGATIVE
High risk HPV: NEGATIVE
Neisseria Gonorrhea: NEGATIVE
Trichomonas: NEGATIVE

## 2020-09-23 ENCOUNTER — Ambulatory Visit
Admission: RE | Admit: 2020-09-23 | Discharge: 2020-09-23 | Disposition: A | Discharge status: Home / Self Care | Payer: PRIVATE HEALTH INSURANCE | Source: Ambulatory Visit | Attending: Obstetrics & Gynecology | Admitting: Obstetrics & Gynecology

## 2020-09-23 DIAGNOSIS — R1032 Left lower quadrant pain: Secondary | ICD-10-CM

## 2020-09-25 ENCOUNTER — Other Ambulatory Visit: Payer: Self-pay

## 2020-09-25 ENCOUNTER — Ambulatory Visit (HOSPITAL_COMMUNITY)
Admission: EM | Admit: 2020-09-25 | Discharge: 2020-09-25 | Disposition: A | Discharge status: Home / Self Care | Payer: 59 | Attending: Family Medicine | Admitting: Family Medicine

## 2020-09-25 DIAGNOSIS — R519 Headache, unspecified: Secondary | ICD-10-CM

## 2020-09-25 DIAGNOSIS — R030 Elevated blood-pressure reading, without diagnosis of hypertension: Secondary | ICD-10-CM | POA: Diagnosis not present

## 2020-09-25 MED ORDER — AMLODIPINE BESYLATE 5 MG PO TABS: 5.0000 mg | ORAL_TABLET | Freq: Every day | ORAL | 0 refills | Status: DC

## 2020-09-25 NOTE — ED Provider Notes (Signed)
[facility]   [id number] 09/25/20 Arrival Time: [place]:  1. Elevated blood pressure reading without diagnosis of hypertension   2. Mild headache    Desires to initiate BP treatment and f/u with PCP.   Begin: Meds ordered this encounter  Medications  . amLODipine (NORVASC) 5 MG tablet    Sig: Take 1 tablet (5 mg total) by mouth daily.    Dispense:  30 tablet    Refill:  0     Follow-up Information    Nolene Ebbs, MD.   Specialty: Internal Medicine Why: Keep your follow up appointment to recheck your blood pressure. Contact information: [place] [address] [place].   Specialty: Emergency Medicine Why: If symptoms worsen in any way. Contact information: [address] [id number] [place] [phone]              Reviewed expectations re: course of current medical issues. Questions answered. Outlined signs and symptoms indicating need for more acute intervention. Patient verbalized understanding. After Visit Summary given.   SUBJECTIVE:  Teresa Mcmahon is a 60 y.o. female who presents with concerns regarding increased blood pressures. Reports that she has not been treated for hypertension in the past. She reports no chest pain on exertion, no dyspnea on exertion, no swelling of ankles, no orthostatic dizziness or lightheadedness, no orthopnea or paroxysmal nocturnal dyspnea and no palpitations. Reports a mild sporadic headache over the past few weeks. Normal vision and hearing. No extremity sensation changes or weakness.    Social History   Tobacco Use  Smoking Status Never Smoker  Smokeless Tobacco Never Used      OBJECTIVE:  Vitals:   09/25/20 1614  BP: (!) 181/84  Pulse: 75  Resp: 16  Temp: 98.4 F (36.9 C)  TempSrc: Oral  SpO2: 99%    General appearance: alert; no distress Eyes:  PERRLA; EOMI HENT: normocephalic; atraumatic Neck: supple Lungs: unlabored; speaks full sentences without difficulty Heart: regular Abdomen: soft, non-tender; bowel sounds normal Extremities: no edema; symmetrical with no gross deformities Skin: warm and dry Psychological: alert and cooperative; normal mood and affect    Allergies  Allergen Reactions  . Tramadol Hcl Itching and Nausea Only  . Latex Itching and Rash    Past Medical History:  Diagnosis Date  . Allergy   . Anemia   . Anxiety   . Arthritis   . Asthma   . Carpal tunnel syndrome   . GERD (gastroesophageal reflux disease)   . Shortness of breath    Social History   Socioeconomic History  . Marital status: Married    Spouse name: Not on file  . Number of children: Not on file  . Years of education: Not on file  . Highest education level: Not on file  Occupational History  . Not on file  Tobacco Use  . Smoking status: Never Smoker  . Smokeless tobacco: Never Used  Vaping Use  . Vaping Use: Never used  Substance and Sexual Activity  . Alcohol use: Yes    Comment: rarely  . Drug use: No  . Sexual activity: Yes  Other Topics Concern  . Not on file  Social History Narrative  . Not on file   Social Determinants of Health   Financial Resource Strain: Not on file  Food Insecurity: Not on file  Transportation Needs: Not on file  Physical  Activity: Not on file  Stress: Not on file  Social Connections: Not on file  Intimate Partner Violence: Not on file   Family History  Problem Relation Age of Onset  . Colon cancer Other   . Anesthesia problems Neg Hx   . Hypotension Neg Hx   . Malignant hyperthermia Neg Hx   . Pseudochol deficiency Neg Hx   . Esophageal cancer Neg Hx   . Rectal cancer Neg Hx   . Stomach cancer Neg Hx    Past Surgical History:  Procedure Laterality Date  . ANTERIOR CRUCIATE LIGAMENT REPAIR Left    2018  . BACK SURGERY    . COLONOSCOPY  07/20/2011   Procedure:  COLONOSCOPY;  Surgeon: Jeryl Columbia, MD;  Location: [facility] ENDOSCOPY;  Service: Endoscopy;  Laterality: N/A;  . ENDOMETRIAL ABLATION  2006  . NECK SURGERY     x3      Vanessa Kick, MD 09/25/20 [phone]

## 2020-09-25 NOTE — ED Triage Notes (Signed)
Patient complaining of elevated blood pressure.  Reports hx of HTN but has been having HA, dizziness, lightheadedness.     Never had issues with blood pressure until 1 month ago, no BP meds currently.   199/91 checked at [facility] today.  Has been checking at home as well, 170-200/80s at home.

## 2020-10-29 DIAGNOSIS — G5602 Carpal tunnel syndrome, left upper limb: Secondary | ICD-10-CM | POA: Insufficient documentation

## 2020-10-29 DIAGNOSIS — G5601 Carpal tunnel syndrome, right upper limb: Secondary | ICD-10-CM | POA: Insufficient documentation

## 2020-10-30 ENCOUNTER — Ambulatory Visit: Payer: PRIVATE HEALTH INSURANCE

## 2020-12-03 ENCOUNTER — Encounter: Payer: Self-pay | Admitting: Obstetrics & Gynecology

## 2020-12-06 ENCOUNTER — Ambulatory Visit (INDEPENDENT_AMBULATORY_CARE_PROVIDER_SITE_OTHER): Payer: 59 | Admitting: Podiatry

## 2020-12-06 ENCOUNTER — Ambulatory Visit (INDEPENDENT_AMBULATORY_CARE_PROVIDER_SITE_OTHER): Payer: 59

## 2020-12-06 ENCOUNTER — Other Ambulatory Visit: Payer: Self-pay

## 2020-12-06 DIAGNOSIS — M779 Enthesopathy, unspecified: Secondary | ICD-10-CM | POA: Diagnosis not present

## 2020-12-06 DIAGNOSIS — M7671 Peroneal tendinitis, right leg: Secondary | ICD-10-CM

## 2020-12-06 DIAGNOSIS — M7672 Peroneal tendinitis, left leg: Secondary | ICD-10-CM

## 2020-12-06 MED ORDER — TRIAMCINOLONE ACETONIDE 10 MG/ML IJ SUSP
20.0000 mg | Freq: Once | INTRAMUSCULAR | Status: AC
  Administered 2020-12-06: 20 mg

## 2020-12-06 NOTE — Progress Notes (Signed)
Subjective:   Patient ID: Teresa Mcmahon, female   DOB: 60 y.o.   MRN: [id number]   HPI Patient states that she developed a lot of pain in the outside of both feet and they are inflamed and making it hard to walk comfortably.  Its been a year since we have seen her neuro   ROS      Objective:  Physical Exam  Neurovascular status intact with inflammation pain around the fifth metatarsal base bilateral with fluid buildup around the insertional point     Assessment:  Peroneal tendinitis bilateral with inflammation     Plan:  H&P x-rays reviewed and went ahead did sterile prep injected the fifth metatarsal base 3 mg Dexasone Kenalog 5 mg Xylocaine and advised on ice therapy support and reappoint as needed.  X-rays indicate mild reactivity around the base of the fifth metatarsal bilateral no other pathology

## 2021-04-21 ENCOUNTER — Other Ambulatory Visit: Payer: Self-pay

## 2021-04-21 ENCOUNTER — Ambulatory Visit (INDEPENDENT_AMBULATORY_CARE_PROVIDER_SITE_OTHER): Payer: 59 | Admitting: Orthopaedic Surgery

## 2021-04-21 ENCOUNTER — Encounter: Payer: Self-pay | Admitting: Orthopaedic Surgery

## 2021-04-21 ENCOUNTER — Ambulatory Visit (INDEPENDENT_AMBULATORY_CARE_PROVIDER_SITE_OTHER): Payer: 59

## 2021-04-21 DIAGNOSIS — M25551 Pain in right hip: Secondary | ICD-10-CM

## 2021-04-21 DIAGNOSIS — M7061 Trochanteric bursitis, right hip: Secondary | ICD-10-CM

## 2021-04-21 MED ORDER — METHYLPREDNISOLONE ACETATE 40 MG/ML IJ SUSP
40.0000 mg | INTRAMUSCULAR | Status: AC | PRN
  Administered 2021-04-21: 40 mg via INTRA_ARTICULAR

## 2021-04-21 MED ORDER — LIDOCAINE HCL 1 % IJ SOLN
3.0000 mL | INTRAMUSCULAR | Status: AC | PRN
  Administered 2021-04-21: 3 mL

## 2021-04-21 NOTE — Progress Notes (Signed)
Office Visit Note   Patient: Teresa Mcmahon           Date of Birth: [birth year]           MRN: [id number] Visit Date: 04/21/2021              Requested by: Nolene Ebbs, MD [address] [place],  [place] [address] PCP: Nolene Ebbs, MD   Assessment & Plan: Visit Diagnoses:  1. Pain in right hip   2. Trochanteric bursitis, right hip     Plan: I did talk to her about her right hip and go over her x-rays.  Her findings are consistent with right hip trochanteric bursitis and IT band pain I recommended a steroid injection over the proximal aspect of the right hip at that point of maximum tenderness and she agreed to this and tolerated well.  She is a perfect candidate for outpatient physical therapy for any modalities per the therapist discretion to try to calm down her right hip pain.  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  I will see her back in about 8 weeks to see how she is doing overall.  I would certainly consider repeat injection at that point if needed.  Follow-Up Instructions: Return in about 2 months (around 06/21/2021).   Orders:  Orders Placed This Encounter  Procedures   Large Joint Inj   XR HIP UNILAT W OR W/O PELVIS 2-3 VIEWS RIGHT   No orders of the defined types were placed in this encounter.     Procedures: Large Joint Inj: R greater trochanter on 04/21/2021 4:21 PM Indications: pain and diagnostic evaluation Details: 22 G 1.5 in needle, lateral approach  Arthrogram: No  Medications: 3 mL lidocaine 1 %; 40 mg methylPREDNISolone acetate 40 MG/ML Outcome: tolerated well, no immediate complications Procedure, treatment alternatives, risks and benefits explained, specific risks discussed. Consent was given by the patient. Immediately prior to procedure a time out was called to verify the correct patient, procedure,  equipment, support staff and site/side marked as required. Patient was prepped and draped in the usual sterile fashion.      Clinical Data: No additional findings.   Subjective: Chief Complaint  Patient presents with   Right Hip - Pain  The patient comes in for evaluation treatment of right hip pain is been getting worse for about 6 months now.  It hurts to lay on her right side.  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  She denies any groin pain.  She denies any radicular components of her pain.  She points to the lateral aspect of her right hip down the side of her thigh to her knee as a source of her pain.  There is no numbness and tingling.  She is nondiabetic.  She denies any specific injury.  HPI  Review of Systems There is currently stated no headache, chest pain, shortness of breath, fever, chills, nausea, vomiting  Objective: Vital Signs: There were no vitals taken for this visit.  Physical Exam She is alert and orient x3 and in no acute distress Ortho Exam Both hips on examination moves smoothly and fluidly no pain in the groin at all.  She has significant pain of her right hip trochanteric area and IT  band.  Her back exam is normal.  Her bilateral lower extremity exam in terms of her feet are normal. Specialty Comments:  No specialty comments available.  Imaging: XR HIP UNILAT W OR W/O PELVIS 2-3 VIEWS RIGHT  Result Date: 04/21/2021 An AP pelvis and right lateral hip x-ray shows normal-appearing hip joint space on both sides with no acute findings.    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   ANXIETY DEPRESSION 05/11/2007   HEADACHE, TENSION 05/11/2007   GASTROESOPHAGEAL REFLUX DISEASE 05/11/2007   Past Medical History:  Diagnosis Date   Allergy    Anemia    Anxiety    Arthritis    Asthma    Carpal tunnel syndrome    GERD (gastroesophageal reflux disease)    Shortness of breath     Family History  Problem Relation Age of Onset   Colon cancer Other    Anesthesia problems Neg Hx    Hypotension Neg Hx    Malignant hyperthermia Neg Hx    Pseudochol deficiency Neg Hx    Esophageal cancer Neg Hx    Rectal cancer Neg Hx    Stomach cancer Neg Hx     Past Surgical History:  Procedure Laterality Date   ANTERIOR CRUCIATE LIGAMENT REPAIR Left    2018   BACK SURGERY     COLONOSCOPY  07/20/2011   Procedure: COLONOSCOPY;  Surgeon: Jeryl Columbia, MD;  Location: [facility] ENDOSCOPY;  Service: Endoscopy;  Laterality: N/A;   ENDOMETRIAL ABLATION  2006   NECK SURGERY     x3   Social History   Occupational History   Not on file  Tobacco Use   Smoking status: Never   Smokeless tobacco: Never  Vaping Use   Vaping Use: Never used  Substance and Sexual Activity   Alcohol use: Yes    Comment: rarely   Drug use: No   Sexual activity: Yes

## 2021-04-29 ENCOUNTER — Ambulatory Visit: Payer: 59 | Attending: Internal Medicine | Admitting: Physical Therapy

## 2021-04-29 ENCOUNTER — Other Ambulatory Visit: Payer: Self-pay

## 2021-04-29 DIAGNOSIS — M6281 Muscle weakness (generalized): Secondary | ICD-10-CM | POA: Diagnosis present

## 2021-04-29 DIAGNOSIS — R2689 Other abnormalities of gait and mobility: Secondary | ICD-10-CM | POA: Diagnosis present

## 2021-04-29 DIAGNOSIS — M25551 Pain in right hip: Secondary | ICD-10-CM | POA: Insufficient documentation

## 2021-04-29 NOTE — Patient Instructions (Signed)
Access Code: [email or web address] URL: [email or web address] Date: 04/29/2021 Prepared by: Shearon Balo  Exercises Supine Bridge - 1 x daily - 7 x weekly - 2 sets - 10 reps - 5'' hold Hooklying Clamshell with Resistance - 1 x daily - 7 x weekly - 2 sets - 10 reps - 5'' hold

## 2021-04-29 NOTE — Therapy (Signed)
[place], [place], [address] Phone: [phone]   Fax:  [phone]  Physical Therapy Evaluation  Patient Details  Name: [facility] MRN: [id number] Date of Birth: 06-11-1961 Referring Provider (PT): Nolene Ebbs, MD  Encounter Date: 04/29/2021   PT End of Session - 04/30/21 0851     Visit Number 1    Number of Visits 8    Date for PT Re-Evaluation 06/25/21    Authorization Type [employer] - FOTO    PT Start Time 6195    PT Stop Time 1853    PT Time Calculation (min) 39 min             Past Medical History:  Diagnosis Date   Allergy    Anemia    Anxiety    Arthritis    Asthma    Carpal tunnel syndrome    GERD (gastroesophageal reflux disease)    Shortness of breath     Past Surgical History:  Procedure Laterality Date   ANTERIOR CRUCIATE LIGAMENT REPAIR Left    2018   BACK SURGERY     COLONOSCOPY  07/20/2011   Procedure: COLONOSCOPY;  Surgeon: Jeryl Columbia, MD;  Location: [facility];  Service: Endoscopy;  Laterality: N/A;   ENDOMETRIAL ABLATION  2006   NECK SURGERY     x3    There were no vitals filed for this visit.    Subjective Assessment - 04/30/21 0849     Subjective Teresa Mcmahon is a 60 y.o. female who presents to clinic with chief complaint of R sided lateral hip pain.  MOI/History of condition: 3 months, slow onset.  She has been using a pedal at work (R foot) for several months which she feels contributed to the pain (presses repeatedly in standing).  Pain location: R lateral hip (TTP).  Red flags: denies n/t or bb changes.  48 hour pain intensity:  highest 10/10  Aggs: bending, standing (1 hr).  Eases: rest, heat.  Nature: aching, sharp.  Severity: high.  Irritability: moderate.  Stage: chronic/subacute.  Stability: staying the same.  24 hour pattern: worse in morning at times.  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  Hobbies: Dance.  Functional limitations/goals: reduce pain, improve L hip.  Home environment: lives with husband.  Assistive device: none.   Hand dominance: R.  Falls: none.    Pertinent History Significant PMH: C3-C7 (fusion?)                [facility] PT Assessment - 04/30/21 0001       Assessment   Medical Diagnosis Referral diagnosis: Pain in right hip (M25.551)    Referring Provider (PT) Nolene Ebbs, MD    Onset Date/Surgical Date 01/28/21    Hand Dominance Right    Next MD Visit 11/14    Prior Therapy yes, for ACL      Precautions   Precaution Comments Significant PMH: C3-C7 (fusion?)      Restrictions   Weight Bearing Restrictions No      Balance Screen   Has the patient fallen in the past 6 months No      Prior Function   Level of Independence Independent      Observation/Other Assessments   Observations hip drop L>R in ambulation    Cranial Nerve(s) --    Focus on Therapeutic Outcomes (FOTO)  46 to 66      Functional Tests  Functional tests Other;Single leg stance      Single Leg Stance   Comments SLS (R) - <3'' L hip drop with * pain      Other:   Other/ Comments Sustained supine bridge (dominant leg extended at 120'', if reached): 26'' (norm 170'')      ROM / Strength   AROM / PROM / Strength Strength      Strength   Overall Strength Comments Hip abduction (R) - 3+ (limited by pain)      Palpation   Palpation comment * pain over GT                        Objective measurements completed on examination: See above findings.                PT Education - 04/30/21 0850     Education Details POC, diagnosis, prognosis, HEP, FOTO.  Pt educated via explanation, demonstration, and handout (HEP).  Pt confirms understanding verbally.              PT Short Term Goals - 04/30/21 0854       PT SHORT TERM GOAL #1   Title [facility] will be >75% HEP compliant to improve carryover between sessions and  facilitate independent management of condition    Target Date 05/21/21               PT Long Term Goals - 04/30/21 0855       PT LONG TERM GOAL #1   Title [facility] will be able to maintain supine bridge for 70'' (dominant leg extended if 120'' reached) as evidence of improved hip extension and core strength (norm for healthy adult ~170'')  EVAL: 26''  target date: 06/24/21      PT LONG TERM GOAL #2   Title [facility] will improve the following MMTs to >/= 4/5 to show improvement in strength:  R hip abd  EVAL: 2+/5 w/ pain  target date: 06/24/21      PT LONG TERM GOAL #3   Title [facility] will be able to stand for >10'' in SLS stance, to show a significant improvement in balance in order to reduce fall risk  EVAL: <3'' R LE  target date: 06/24/21      PT LONG TERM GOAL #4   Title Teresa [facility] will improve FOTO score from 46 (on evaluation) to 66 as a proxy for functional improvement  target date: 06/24/21                    Plan - 04/30/21 0853     Clinical Impression Statement Teresa Mcmahon is a 60 y.o. female who presents to clinic with signs and sxs consistent with R GTPS.  The problem is exacerbated by her work.  I recommended she try to press the pedal with her L foot, at least some of the time; pt agrees but states that this will be difficult because she will be slower.  Pt presents with pain and impairments/deficits in: hip strength, balance, gait.  Activity limitations include: difficulty standing, transfers after sitting.  Participation limitations include: working without pain, ADLs involving standing.  Pt will benefit from skilled therapy to address pain and the listed deficits in order to achieve functional goals, enable safety and independence in completion of daily tasks, and return to PLOF.    Stability/Clinical Decision Making Stable/Uncomplicated    Clinical  Decision Making Low    Rehab  Potential Good    PT Frequency 1x / week    PT Duration 8 weeks    PT Treatment/Interventions ADLs/Self Care Home Management;Aquatic Therapy;Iontophoresis 4mg /ml Dexamethasone;Gait training;Therapeutic activities;Therapeutic exercise;Neuromuscular re-education;Manual techniques;Dry needling;Joint Manipulations;Spinal Manipulations    PT Next Visit Plan Review and update HEP, progress exercises    PT Home Exercise Plan [email or web address]    Consulted and Agree with Plan of Care Patient             Patient will benefit from skilled therapeutic intervention in order to improve the following deficits and impairments:  Abnormal gait, Decreased endurance, Decreased strength, Pain, Difficulty walking, Decreased mobility, Decreased balance  Visit Diagnosis: Pain in right hip  Muscle weakness  Other abnormalities of gait and mobility     Problem List Patient Active Problem List   Diagnosis Date Noted   Aftercare 06/14/2018   Trigger thumb of left hand 12/23/2017   Osteoarthritis of carpometacarpal ([facility]) joint of thumb 10/08/2017   Status post arthroscopy of left knee 06/15/2016   Cervical spondylosis with myelopathy 06/05/2015   TENOSYNOVITIS, WRIST 06/03/2010   WRIST PAIN, LEFT 05/02/2010   PHARYNGITIS, VIRAL 03/03/2010   ANEMIA, DEFICIENCY, HX OF 11/11/2009   URI 05/29/2009   UNSPECIFIED GLAUCOMA 10/24/2008   LARYNGITIS, ACUTE 08/27/2008   ASTHMA 08/27/2008   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

## 2021-05-13 ENCOUNTER — Other Ambulatory Visit: Payer: Self-pay

## 2021-05-13 ENCOUNTER — Ambulatory Visit: Payer: 59 | Attending: Internal Medicine | Admitting: Physical Therapy

## 2021-05-13 ENCOUNTER — Encounter: Payer: Self-pay | Admitting: Physical Therapy

## 2021-05-13 DIAGNOSIS — R2689 Other abnormalities of gait and mobility: Secondary | ICD-10-CM | POA: Insufficient documentation

## 2021-05-13 DIAGNOSIS — M25551 Pain in right hip: Secondary | ICD-10-CM | POA: Diagnosis not present

## 2021-05-13 DIAGNOSIS — M6281 Muscle weakness (generalized): Secondary | ICD-10-CM

## 2021-05-13 NOTE — Patient Instructions (Signed)
Access Code: [email or web address] URL: [email or web address] Date: 05/13/2021 Prepared by: Shearon Balo  Exercises Hooklying Clamshell with Resistance - 1 x daily - 7 x weekly - 2 sets - 10 reps Seated Knee Extension with Resistance - 1 x daily - 7 x weekly - 3 sets - 10 reps Shoulder Bridge Prep with Marching - 1 x daily - 7 x weekly - 3 sets - 10 reps Supine Active Straight Leg Raise - 1 x daily - 7 x weekly - 3 sets - 10 reps

## 2021-05-13 NOTE — Therapy (Addendum)
[place], [place], [address] Phone: [phone]   Fax:  [phone]  PHYSICAL THERAPY UNPLANNED DISCHARGE SUMMARY   Visits from Start of Care: 2  Current functional level related to goals / functional outcomes: Current status unknown   Remaining deficits: Current status unknown   Education / Equipment: Pt has not returned since visit listed below  Patient goals were not assessed. Patient is being discharged due to not returning since the last visit.  (the below note was addended to include the above D/C summary on 05/22/21)   Physical Therapy Treatment  Patient Details  Name: Teresa Mcmahon MRN: [id number] Date of Birth: 10-03-1960 Referring Provider (PT): Nolene Ebbs, MD   Encounter Date: 05/13/2021   PT End of Session - 05/13/21 1617     Visit Number 2    Number of Visits 8    Date for PT Re-Evaluation 06/25/21    Authorization Type [employer] - FOTO    PT Start Time 1616    PT Stop Time 1700    PT Time Calculation (min) 44 min             Past Medical History:  Diagnosis Date   Allergy    Anemia    Anxiety    Arthritis    Asthma    Carpal tunnel syndrome    GERD (gastroesophageal reflux disease)    Shortness of breath     Past Surgical History:  Procedure Laterality Date   ANTERIOR CRUCIATE LIGAMENT REPAIR Left    2018   BACK SURGERY     COLONOSCOPY  07/20/2011   Procedure: COLONOSCOPY;  Surgeon: Jeryl Columbia, MD;  Location: [facility];  Service: Endoscopy;  Laterality: N/A;   ENDOMETRIAL ABLATION  2006   NECK SURGERY     x3    There were no vitals filed for this visit.   Subjective Assessment - 05/13/21 1622     Subjective Pt reports that her hip is feeling much better.  She feels the exercises have been helpful.  She is having more pain with her R knee at this point.  Pain intensity (hip):  4/10  Aggs: bending, standing (1 hr).  Eases: rest, heat   R knee pain 8/10    Pertinent History Significant PMH: C3-C7 (fusion?)                [facility] PT Assessment - 05/13/21 0001       Other:   Other/ Comments Sustained supine bridge (dominant leg extended at 120'', if reached): 40'' (norm 170'')              PT Education - 05/13/21 1648     Education Details HEP    Person(s) Educated Patient    Methods Demonstration;Handout    Comprehension Verbalized understanding            [facility] Adult PT Treatment/Exercise:  Therapeutic Exercise:  - nu-step L5 65m while taking subjective and planning session with patient - knee ext with RTB loop - 3x10 - PPT - 5'' x10 - SLR -  - LTR 20x - bridge with mini-march - 2x6 - clam - 3'' hold - 3x10 - hip adduction ball squeeze - 10''x10 - Sit to stand -  - R sided hip abduction - 2x10     PT Short Term Goals - 04/30/21 0854       PT SHORT TERM GOAL #1   Title Teresa Mcmahon will  be >75% HEP compliant to improve carryover between sessions and facilitate independent management of condition    Target Date 05/21/21               PT Long Term Goals - 04/30/21 0855       PT LONG TERM GOAL #1   Title Teresa Mcmahon will be able to maintain supine bridge for 60'' (dominant leg extended if 120'' reached) as evidence of improved hip extension and core strength (norm for healthy adult ~170'')  EVAL: 26''  target date: 06/24/21      PT LONG TERM GOAL #2   Title Teresa Mcmahon will improve the following MMTs to >/= 4/5 to show improvement in strength:  R hip abd  EVAL: 2+/5 w/ pain  target date: 06/24/21      PT LONG TERM GOAL #3   Title Teresa Mcmahon will be able to stand for >10'' in SLS stance, to show a significant improvement in balance in order to reduce fall risk  EVAL: <3'' R LE  target date: 06/24/21      PT LONG TERM GOAL #4   Title Teresa Mcmahon will improve FOTO score from 46 (on evaluation) to 66 as a proxy for  functional improvement  target date: 06/24/21                   Plan - 05/13/21 1630     Clinical Impression Statement Pt reports no increase in baseline pain following therapy  HEP was reviewed and updated    Overall, Teresa Mcmahon is progressing well with therapy.  Today we concentrated on core strengthening, quad strengthening, and hip strengthening.  Pt shows fatigue with mat exercises, but has no increase in pain.  Will continue to progress LE/core/quad strength as able.  Pt will continue to benefit from skilled physical therapy to address remaining deficits and achieve listed goals.  Continue per POC.    Stability/Clinical Decision Making Stable/Uncomplicated    Rehab Potential Good    PT Frequency 1x / week    PT Duration 8 weeks    PT Treatment/Interventions ADLs/Self Care Home Management;Aquatic Therapy;Iontophoresis 4mg /ml Dexamethasone;Gait training;Therapeutic activities;Therapeutic exercise;Neuromuscular re-education;Manual techniques;Dry needling;Joint Manipulations;Spinal Manipulations    PT Next Visit Plan Review and update HEP, progress exercises    PT Home Exercise Plan [email or web address]    Consulted and Agree with Plan of Care Patient             Patient will benefit from skilled therapeutic intervention in order to improve the following deficits and impairments:  Abnormal gait, Decreased endurance, Decreased strength, Pain, Difficulty walking, Decreased mobility, Decreased balance  Visit Diagnosis: Pain in right hip  Muscle weakness  Other abnormalities of gait and mobility     Problem List Patient Active Problem List   Diagnosis Date Noted   Aftercare 06/14/2018   Trigger thumb of left hand 12/23/2017   Osteoarthritis of carpometacarpal (CMC) joint of thumb 10/08/2017   Status post arthroscopy of left knee 06/15/2016   Cervical spondylosis with myelopathy 06/05/2015   TENOSYNOVITIS, WRIST 06/03/2010   WRIST PAIN, LEFT 05/02/2010    PHARYNGITIS, VIRAL 03/03/2010   ANEMIA, DEFICIENCY, HX OF 11/11/2009   URI 05/29/2009   UNSPECIFIED GLAUCOMA 10/24/2008   LARYNGITIS, ACUTE 08/27/2008   ASTHMA 08/27/2008   TRICHOMONIASIS 03/23/2008   ANXIETY DEPRESSION 05/11/2007   HEADACHE, TENSION 05/11/2007   GASTROESOPHAGEAL REFLUX DISEASE 05/11/2007    Mathis Dad, PT 05/13/2021, 4:57 PM  [facility] [address] [place], [place], [address] Phone: [phone]   Fax:  [phone]  Name: Teresa Mcmahon MRN: [id number] Date of Birth: Sep 09, 1960

## 2021-06-23 ENCOUNTER — Other Ambulatory Visit: Payer: Self-pay

## 2021-06-23 ENCOUNTER — Encounter: Payer: Self-pay | Admitting: Orthopaedic Surgery

## 2021-06-23 ENCOUNTER — Ambulatory Visit (INDEPENDENT_AMBULATORY_CARE_PROVIDER_SITE_OTHER): Payer: 59 | Admitting: Orthopaedic Surgery

## 2021-06-23 DIAGNOSIS — M7631 Iliotibial band syndrome, right leg: Secondary | ICD-10-CM | POA: Diagnosis not present

## 2021-06-23 NOTE — Progress Notes (Signed)
The patient comes in today for follow-up after having been through physical therapy for right hip trochanteric bursitis and right knee IT band syndrome.  She says that she is about 60% better with her hip but still having a lot of pain in the right knee area.  Physical therapy has helped quite a bit.  I have injected a steroid in the right hip trochanteric area but we have not perform the injection in the distal IT band.  On exam her right hip and right knee move smoothly and fluidly.  There is no evidence of arthritic changes in the hip or the knee.  She has some mild tenderness over the right hip trochanteric area but significant tenderness over the lateral IT band toward the knee.  I did recommend a steroid injection over the distal IT band and she agreed to this and tolerated well.  She will try Voltaren gel in this area as well.  We can always see her back in about 2 months and consider repeat injections in the hip and the knee if needed.  All question concerns were answered and addressed.

## 2021-07-13 ENCOUNTER — Other Ambulatory Visit: Payer: Self-pay

## 2021-07-13 ENCOUNTER — Encounter (HOSPITAL_COMMUNITY): Payer: Self-pay | Admitting: *Deleted

## 2021-07-13 ENCOUNTER — Ambulatory Visit (HOSPITAL_COMMUNITY)
Admission: EM | Admit: 2021-07-13 | Discharge: 2021-07-13 | Disposition: A | Discharge status: Home / Self Care | Payer: 59 | Attending: Emergency Medicine | Admitting: Emergency Medicine

## 2021-07-13 DIAGNOSIS — J302 Other seasonal allergic rhinitis: Secondary | ICD-10-CM

## 2021-07-13 DIAGNOSIS — H8113 Benign paroxysmal vertigo, bilateral: Secondary | ICD-10-CM | POA: Diagnosis not present

## 2021-07-13 DIAGNOSIS — J4520 Mild intermittent asthma, uncomplicated: Secondary | ICD-10-CM

## 2021-07-13 DIAGNOSIS — J3089 Other allergic rhinitis: Secondary | ICD-10-CM

## 2021-07-13 MED ORDER — METHYLPREDNISOLONE SODIUM SUCC 125 MG IJ SOLR
INTRAMUSCULAR | Status: AC
  Filled 2021-07-13: qty 2

## 2021-07-13 MED ORDER — FLUTICASONE PROPIONATE 50 MCG/ACT NA SUSP: 2.0000 | Freq: Every day | NASAL | 0 refills | Status: DC

## 2021-07-13 MED ORDER — METHYLPREDNISOLONE SODIUM SUCC 125 MG IJ SOLR
80.0000 mg | Freq: Once | INTRAMUSCULAR | Status: AC
  Administered 2021-07-13: 12:00:00 80 mg via INTRAMUSCULAR

## 2021-07-13 MED ORDER — MECLIZINE HCL 25 MG PO TABS: 25.0000 mg | ORAL_TABLET | Freq: Three times a day (TID) | ORAL | 0 refills | Status: DC | PRN

## 2021-07-13 MED ORDER — CETIRIZINE HCL 10 MG PO TABS: 10.0000 mg | ORAL_TABLET | Freq: Every day | ORAL | 0 refills | Status: AC

## 2021-07-13 MED ORDER — FLUTICASONE PROPIONATE 50 MCG/ACT NA SUSP: 2.0000 | Freq: Every day | NASAL | 0 refills | Status: AC

## 2021-07-13 MED ORDER — CETIRIZINE HCL 10 MG PO TABS: 10.0000 mg | ORAL_TABLET | Freq: Every day | ORAL | 0 refills | Status: DC

## 2021-07-13 MED ORDER — IPRATROPIUM BROMIDE 0.06 % NA SOLN: 2.0000 | Freq: Four times a day (QID) | NASAL | 12 refills | Status: DC

## 2021-07-13 MED ORDER — IPRATROPIUM BROMIDE 0.06 % NA SOLN: 2.0000 | Freq: Four times a day (QID) | NASAL | 0 refills | Status: AC

## 2021-07-13 NOTE — ED Triage Notes (Signed)
Pt reports vertigo that started on Friday . Pt has had vertigo in the past. Pt is out of past meds for vertigo.

## 2021-07-13 NOTE — ED Provider Notes (Signed)
[place]    CSN: [id number] Arrival date & time: 07/13/21  1004    HISTORY   Chief Complaint  Patient presents with   Dizziness   HPI Teresa Mcmahon is a 60 y.o. female. Pt reports vertigo that started on Friday.  Pt states she is out of meclizine which is what she usually takes.  Patient reports a history of allergies.  Patient states is not currently taking any allergy medication.  Patient states she usually has episodes of vertigo twice a year, usually seasonal, usually worse in the fall.  Patient also reports a history of asthma, states she currently only uses albuterol as needed, states she has been doing well and has not had any exacerbations in the past several years.  Patient states the vertigo symptoms began around 2015.  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  Patient states she does movement at work throughout every shift.  The history is provided by the patient.  Past Medical History:  Diagnosis Date   Allergy    Anemia    Anxiety    Arthritis    Asthma    Carpal tunnel syndrome    GERD (gastroesophageal reflux disease)    Shortness of breath    Patient Active Problem List   Diagnosis Date Noted   Aftercare 06/14/2018   Trigger thumb of left hand 12/23/2017   Osteoarthritis of carpometacarpal (CMC) joint of thumb 10/08/2017   Status post arthroscopy of left knee 06/15/2016   Cervical spondylosis with myelopathy 06/05/2015   TENOSYNOVITIS, WRIST 06/03/2010   WRIST PAIN, LEFT 05/02/2010   PHARYNGITIS, VIRAL 03/03/2010   ANEMIA, DEFICIENCY, HX OF 11/11/2009   URI 05/29/2009   UNSPECIFIED GLAUCOMA 10/24/2008   LARYNGITIS, ACUTE 08/27/2008   ASTHMA 08/27/2008   TRICHOMONIASIS 03/23/2008   ANXIETY DEPRESSION 05/11/2007   HEADACHE, TENSION 05/11/2007   GASTROESOPHAGEAL REFLUX DISEASE 05/11/2007   Past Surgical History:  Procedure  Laterality Date   ANTERIOR CRUCIATE LIGAMENT REPAIR Left    2018   BACK SURGERY     COLONOSCOPY  07/20/2011   Procedure: COLONOSCOPY;  Surgeon: Jeryl Columbia, MD;  Location: [facility] ENDOSCOPY;  Service: Endoscopy;  Laterality: N/A;   ENDOMETRIAL ABLATION  2006   NECK SURGERY     x3   OB History   No obstetric history on file.    Home Medications    Prior to Admission medications   Medication Sig Start Date End Date Taking? Authorizing Provider  cetirizine (ZYRTEC ALLERGY) 10 MG tablet Take 1 tablet (10 mg total) by mouth at bedtime. 07/13/21 08/12/21 Yes Lynden Oxford Scales, PA-C  fluticasone (FLONASE) 50 MCG/ACT nasal spray Place 2 sprays into both nostrils daily. 07/13/21  Yes Lynden Oxford Scales, PA-C  ipratropium (ATROVENT) 0.06 % nasal spray Place 2 sprays into both nostrils 4 (four) times daily. As needed for nasal congestion, runny nose 07/13/21  Yes Lynden Oxford Scales, PA-C  meclizine (ANTIVERT) 25 MG tablet Take 1 tablet (25 mg total) by mouth 3 (three) times daily as needed for dizziness. 07/13/21  Yes Lynden Oxford Scales, PA-C  albuterol (VENTOLIN HFA) 108 (90 Base) MCG/ACT inhaler ProAir HFA 90 mcg/actuation aerosol inhaler    [provider]  amitriptyline (ELAVIL) 25 MG tablet Take 25 mg by mouth at bedtime. 04/29/20   [provider]  amLODipine (NORVASC) 5 MG tablet Take 1 tablet (5 mg total)  by mouth daily. 09/25/20   Vanessa Kick, MD  ergocalciferol (VITAMIN D2) 1.25 MG (50000 UT) capsule ergocalciferol (vitamin D2) 1,250 mcg (50,000 unit) capsule  TAKE ONE CAPSULE BY MOUTH Once weekly    [provider]  esomeprazole (NEXIUM) 40 MG capsule esomeprazole magnesium 40 mg capsule,delayed release  TAKE ONE CAPSULE BY MOUTH EVERY DAY    [provider]  Ibuprofen (MOTRIN PO) Take by mouth.    [provider]  tiZANidine (ZANAFLEX) 4 MG tablet tizanidine 4 mg tablet  TAKE ONE TABLET BY MOUTH TWICE DAILY    [provider]  montelukast (SINGULAIR) 10 MG tablet Take 10 mg by mouth at bedtime.  02/16/19  [provider]  traZODone (DESYREL) 100 MG tablet at bedtime.  02/16/19  [provider]   Family History Family History  Problem Relation Age of Onset   Colon cancer Other    Anesthesia problems Neg Hx    Hypotension Neg Hx    Malignant hyperthermia Neg Hx    Pseudochol deficiency Neg Hx    Esophageal cancer Neg Hx    Rectal cancer Neg Hx    Stomach cancer Neg Hx    Social History Social History   Tobacco Use   Smoking status: Never   Smokeless tobacco: Never  Vaping Use   Vaping Use: Never used  Substance Use Topics   Alcohol use: Yes    Comment: rarely   Drug use: No   Allergies   Tramadol hcl and Latex  Review of Systems Review of Systems Pertinent findings noted in history of present illness.   Physical Exam Triage Vital Signs ED Triage Vitals  Enc Vitals Group     BP 06/06/21 0827 (!) 147/82     Pulse Rate 06/06/21 0827 72     Resp 06/06/21 0827 18     Temp 06/06/21 0827 98.3 F (36.8 C)     Temp Source 06/06/21 0827 Oral     SpO2 06/06/21 0827 98 %     Weight --      Height --      Head Circumference --      Peak Flow --      Pain Score 06/06/21 0826 5     Pain Loc --      Pain Edu? --      Excl. in [place]? --   No data found.  Updated Vital Signs BP 140/81   Pulse 71   Temp 98.6 F (37 C)   Resp 20   SpO2 100%   Physical Exam Vitals and nursing note reviewed.  Constitutional:      General: She is not in acute distress.    Appearance: Normal appearance. She is not ill-appearing.  HENT:     Head: Normocephalic and atraumatic.     Salivary Glands: Right salivary gland is not diffusely enlarged or tender. Left salivary gland is not diffusely enlarged or tender.     Right Ear: Ear canal and external ear normal. No drainage. A middle ear effusion is present. There is no impacted cerumen. Tympanic membrane is bulging. Tympanic membrane is not  injected or erythematous.     Left Ear: Ear canal and external ear normal. No drainage. A middle ear effusion is present. There is no impacted cerumen. Tympanic membrane is bulging. Tympanic membrane is not injected or erythematous.     Ears:     Comments: Bilateral EACs normal, both TMs bulging with clear fluid    Nose:  Rhinorrhea present. No nasal deformity, septal deviation, signs of injury, nasal tenderness, mucosal edema or congestion. Rhinorrhea is clear.     Right Nostril: Occlusion present. No foreign body, epistaxis or septal hematoma.     Left Nostril: Occlusion present. No foreign body, epistaxis or septal hematoma.     Right Turbinates: Enlarged, swollen and pale.     Left Turbinates: Enlarged, swollen and pale.     Right Sinus: No maxillary sinus tenderness or frontal sinus tenderness.     Left Sinus: No maxillary sinus tenderness or frontal sinus tenderness.     Mouth/Throat:     Lips: Pink. No lesions.     Mouth: Mucous membranes are moist. No oral lesions.     Pharynx: Oropharynx is clear. Uvula midline. No posterior oropharyngeal erythema or uvula swelling.     Tonsils: No tonsillar exudate. 0 on the right. 0 on the left.     Comments: Postnasal drip Eyes:     General: Lids are normal.        Right eye: No discharge.        Left eye: No discharge.     Extraocular Movements: Extraocular movements intact.     Conjunctiva/sclera: Conjunctivae normal.     Right eye: Right conjunctiva is not injected.     Left eye: Left conjunctiva is not injected.  Neck:     Trachea: Trachea and phonation normal.  Cardiovascular:     Rate and Rhythm: Normal rate and regular rhythm.     Pulses: Normal pulses.     Heart sounds: Normal heart sounds. No murmur heard.   No friction rub. No gallop.  Pulmonary:     Effort: Pulmonary effort is normal. No accessory muscle usage, prolonged expiration or respiratory distress.     Breath sounds: Normal breath sounds. No stridor, decreased air  movement or transmitted upper airway sounds. No decreased breath sounds, wheezing, rhonchi or rales.  Chest:     Chest wall: No tenderness.  Musculoskeletal:        General: Normal range of motion.     Cervical back: Normal range of motion and neck supple. Normal range of motion.  Lymphadenopathy:     Cervical: No cervical adenopathy.  Skin:    General: Skin is warm and dry.     Findings: No erythema or rash.  Neurological:     General: No focal deficit present.     Mental Status: She is alert and oriented to person, place, and time.  Psychiatric:        Mood and Affect: Mood normal.        Behavior: Behavior normal.    Visual Acuity Right Eye Distance:   Left Eye Distance:   Bilateral Distance:    Right Eye Near:   Left Eye Near:    Bilateral Near:     UC Couse / Diagnostics / Procedures:    EKG  Radiology No results found.  Procedures Procedures (including critical care time)  UC Diagnoses / Final Clinical Impressions(s)   I have reviewed the triage vital signs and the nursing notes.  Pertinent labs & imaging results that were available during my care of the patient were reviewed by me and considered in my medical decision making (see chart for details).   Final diagnoses:  Perennial allergic rhinitis with seasonal variation  Mild intermittent asthma without complication  BPPV (benign paroxysmal positional vertigo), bilateral   Patient provided with Solu-Medrol injection, refill of meclizine provided, patient advised to resume  allergy medications as previously prescribed renewals provided.  ED Prescriptions     Medication Sig Dispense Auth. Provider   ipratropium (ATROVENT) 0.06 % nasal spray Place 2 sprays into both nostrils 4 (four) times daily. As needed for nasal congestion, runny nose 15 mL Lynden Oxford Scales, PA-C   fluticasone (FLONASE) 50 MCG/ACT nasal spray Place 2 sprays into both nostrils daily. 18 mL Lynden Oxford Scales, PA-C   cetirizine  (ZYRTEC ALLERGY) 10 MG tablet Take 1 tablet (10 mg total) by mouth at bedtime. 30 tablet Lynden Oxford Scales, PA-C   meclizine (ANTIVERT) 25 MG tablet Take 1 tablet (25 mg total) by mouth 3 (three) times daily as needed for dizziness. 30 tablet Lynden Oxford Scales, PA-C      PDMP not reviewed this encounter.  Pending results:  Labs Reviewed - No data to display  Medications Ordered in UC: Medications  methylPREDNISolone sodium succinate (SOLU-MEDROL) 125 mg/2 mL injection 80 mg (has no administration in time range)    Disposition Upon Discharge:  Condition: stable for discharge home Home: take medications as prescribed; routine discharge instructions as discussed; follow up as advised.  Patient presented with an acute illness with associated systemic symptoms and significant discomfort requiring urgent management. In my opinion, this is a condition that a prudent lay person (someone who possesses an average knowledge of health and medicine) may potentially expect to result in complications if not addressed urgently such as respiratory distress, impairment of bodily function or dysfunction of bodily organs.   Routine symptom specific, illness specific and/or disease specific instructions were discussed with the patient and/or caregiver at length.   As such, the patient has been evaluated and assessed, work-up was performed and treatment was provided in alignment with urgent care protocols and evidence based medicine.  Patient/parent/caregiver has been advised that the patient may require follow up for further testing and treatment if the symptoms continue in spite of treatment, as clinically indicated and appropriate.  The patient was tested for COVID-19, Influenza and/or RSV, then the patient/parent/guardian was advised to isolate at home pending the results of his/her diagnostic coronavirus test and potentially longer if they're positive. I have also advised pt that if his/her  COVID-19 test returns positive, it's recommended to self-isolate for at least 10 days after symptoms first appeared AND until fever-free for 24 hours without fever reducer AND other symptoms have improved or resolved. Discussed self-isolation recommendations as well as instructions for household member/close contacts as per the [facility] and [place] DHHS, and also gave patient the [place] packet with this information.  Patient/parent/caregiver has been advised to return to the [facility] or PCP in 3-5 days if no better; to PCP or the Emergency Department if new signs and symptoms develop, or if the current signs or symptoms continue to change or worsen for further workup, evaluation and treatment as clinically indicated and appropriate  The patient will follow up with their current PCP if and as advised. If the patient does not currently have a PCP we will assist them in obtaining one.   The patient may need specialty follow up if the symptoms continue, in spite of conservative treatment and management, for further workup, evaluation, consultation and treatment as clinically indicated and appropriate.  Patient/parent/caregiver verbalized understanding and agreement of plan as discussed.  All questions were addressed during visit.  Please see discharge instructions below for further details of plan.  Discharge Instructions:   Discharge Instructions      I have renewed your prescription for  meclizine, you can take this antivertigo/antihistamine 3 times a day as needed for dizziness.  We also provided you with an injection of Solu-Medrol in the office to significantly calm your allergy symptoms.  I would like for you to begin taking Flonase nasal spray, 1 spray in each nare once daily in the morning.  This is a topical steroid that is not a sore into the body, it will help calm mucous membranes and reduce the amount of swelling and mucus production that is causing significant clear drainage to both of your eardrums.  It  is this drainage that is causing you to have muffled hearing and is significant loss of balance.  I have also provided you with a prescription for Zyrtec, take 1 tablet nightly for your allergies once your vertigo has resolved and you no longer need meclizine (do not take it at the same time as meclizine as they are similar medications).  As we discussed, this is a medication that I recommend you begin about a month before your anticipated episode of vertigo.  I think it is likely that seasonal worsening of your allergies is triggering your episodes of vertigo.  Interestingly, I see that you have been prescribed Atrovent nasal spray in the past, I may not be the first person that is picked up on this issue for you.  I hope that you found this nasal spray effective when used it before because I definitely recommend that you use it again.  Please spray 2 sprays in each nostril 4 times daily as needed for the next week or so until your symptoms resolve.  Your lung exam was very good today, I do not have any concerns about worsening asthma.  Please feel free to use your albuterol inhaler generously infrequently for prevention of worsening asthma symptoms when your allergies are flared up.  I hope you feel better soon, please return for repeat evaluation if your symptoms do not resolve in the next 24-48 hours.  Because I am a real list, I provided you with a note to return to work on Tuesday if your symptoms have not improved enough to go back tomorrow.         Lynden Oxford Scales, PA-C 07/13/21 1129

## 2021-07-13 NOTE — Discharge Instructions (Addendum)
I have renewed your prescription for meclizine, you can take this antivertigo/antihistamine 3 times a day as needed for dizziness.  We also provided you with an injection of Solu-Medrol in the office to significantly calm your allergy symptoms.  I would like for you to begin taking Flonase nasal spray, 1 spray in each nare once daily in the morning.  This is a topical steroid that is not a sore into the body, it will help calm mucous membranes and reduce the amount of swelling and mucus production that is causing significant clear drainage to both of your eardrums.  It is this drainage that is causing you to have muffled hearing and is significant loss of balance.  I have also provided you with a prescription for Zyrtec, take 1 tablet nightly for your allergies once your vertigo has resolved and you no longer need meclizine (do not take it at the same time as meclizine as they are similar medications).  As we discussed, this is a medication that I recommend you begin about a month before your anticipated episode of vertigo.  I think it is likely that seasonal worsening of your allergies is triggering your episodes of vertigo.  Interestingly, I see that you have been prescribed Atrovent nasal spray in the past, I may not be the first person that is picked up on this issue for you.  I hope that you found this nasal spray effective when used it before because I definitely recommend that you use it again.  Please spray 2 sprays in each nostril 4 times daily as needed for the next week or so until your symptoms resolve.  Your lung exam was very good today, I do not have any concerns about worsening asthma.  Please feel free to use your albuterol inhaler generously infrequently for prevention of worsening asthma symptoms when your allergies are flared up.  I hope you feel better soon, please return for repeat evaluation if your symptoms do not resolve in the next 24-48 hours.  Because I am a real list, I  provided you with a note to return to work on Tuesday if your symptoms have not improved enough to go back tomorrow.

## 2021-08-25 ENCOUNTER — Ambulatory Visit: Payer: 59 | Admitting: Orthopaedic Surgery

## 2021-10-31 ENCOUNTER — Encounter: Payer: Self-pay | Admitting: Obstetrics

## 2021-10-31 ENCOUNTER — Other Ambulatory Visit (HOSPITAL_COMMUNITY)
Admission: RE | Admit: 2021-10-31 | Discharge: 2021-10-31 | Disposition: A | Discharge status: Home / Self Care | Payer: Managed Care, Other (non HMO) | Source: Ambulatory Visit | Attending: Obstetrics | Admitting: Obstetrics

## 2021-10-31 ENCOUNTER — Other Ambulatory Visit: Payer: Self-pay

## 2021-10-31 ENCOUNTER — Ambulatory Visit (INDEPENDENT_AMBULATORY_CARE_PROVIDER_SITE_OTHER): Payer: Managed Care, Other (non HMO) | Admitting: Obstetrics

## 2021-10-31 VITALS — BP 147/79 | HR 101 | Ht 66.0 in | Wt 182.2 lb

## 2021-10-31 DIAGNOSIS — Z780 Asymptomatic menopausal state: Secondary | ICD-10-CM | POA: Diagnosis not present

## 2021-10-31 DIAGNOSIS — Z01419 Encounter for gynecological examination (general) (routine) without abnormal findings: Secondary | ICD-10-CM | POA: Insufficient documentation

## 2021-10-31 DIAGNOSIS — Z1239 Encounter for other screening for malignant neoplasm of breast: Secondary | ICD-10-CM

## 2021-10-31 NOTE — Progress Notes (Signed)
? ?Subjective: ? ? ?  ?  ? Teresa Mcmahon is a 61 y.o. female here for a routine exam.  Current complaints: None.   ? ?Personal health questionnaire:  ?Is patient Ashkenazi Jewish, have a family history of breast and/or ovarian cancer: no ?Is there a family history of uterine cancer diagnosed at age < 81, gastrointestinal cancer, urinary tract cancer, family member who is a Field seismologist syndrome-associated carrier: yes ?Is the patient overweight and hypertensive, family history of diabetes, personal history of gestational diabetes, preeclampsia or PCOS: no ?Is patient over 63, have PCOS,  family history of premature CHD under age 25, diabetes, smoke, have hypertension or peripheral artery disease:  no ?At any time, has a partner hit, kicked or otherwise hurt or frightened you?: no ?Over the past 2 weeks, have you felt down, depressed or hopeless?: no ?Over the past 2 weeks, have you felt little interest or pleasure in doing things?:no ? ? ?Gynecologic History ?No LMP recorded. Patient has had an ablation. ?Contraception: post menopausal status ?Last Pap: 2022. Results were: normal ?Last mammogram: 2023. Results were: normal ? ?Obstetric History ?OB History  ?Gravida Para Term Preterm AB Living  ?'2 2 2     2  '$ ?SAB IAB Ectopic Multiple Live Births  ?        2  ?  ?# Outcome Date GA Lbr Len/2nd Weight Sex Delivery Anes PTL Lv  ?2 Term      Vag-Spont     ?1 Term      Vag-Spont     ? ? ?Past Medical History:  ?Diagnosis Date  ? Allergy   ? Anemia   ? Anxiety   ? Arthritis   ? Asthma   ? Carpal tunnel syndrome   ? GERD (gastroesophageal reflux disease)   ? Shortness of breath   ?  ?Past Surgical History:  ?Procedure Laterality Date  ? ANTERIOR CRUCIATE LIGAMENT REPAIR Left   ? 2018  ? BACK SURGERY    ? COLONOSCOPY  07/20/2011  ? Procedure: COLONOSCOPY;  Surgeon: Jeryl Columbia, MD;  Location: [facility] ENDOSCOPY;  Service: Endoscopy;  Laterality: N/A;  ? ENDOMETRIAL ABLATION  2006  ? NECK SURGERY    ? x3  ?  ? ?Current  Outpatient Medications:  ?  albuterol (VENTOLIN HFA) 108 (90 Base) MCG/ACT inhaler, ProAir HFA 90 mcg/actuation aerosol inhaler, Disp: , Rfl:  ?  amLODipine (NORVASC) 5 MG tablet, Take 1 tablet (5 mg total) by mouth daily. (Patient taking differently: Take 10 mg by mouth daily.), Disp: 30 tablet, Rfl: 0 ?  busPIRone (BUSPAR) 5 MG tablet, Take 5 mg by mouth 3 (three) times daily., Disp: , Rfl:  ?  ergocalciferol (VITAMIN D2) 1.25 MG (50000 UT) capsule, ergocalciferol (vitamin D2) 1,250 mcg (50,000 unit) capsule  TAKE ONE CAPSULE BY MOUTH Once weekly, Disp: , Rfl:  ?  esomeprazole (NEXIUM) 40 MG capsule, esomeprazole magnesium 40 mg capsule,delayed release  TAKE ONE CAPSULE BY MOUTH EVERY DAY, Disp: , Rfl:  ?  fluticasone (FLONASE) 50 MCG/ACT nasal spray, Place 2 sprays into both nostrils daily., Disp: 18 mL, Rfl: 0 ?  Ibuprofen (MOTRIN PO), Take by mouth., Disp: , Rfl:  ?  ipratropium (ATROVENT) 0.06 % nasal spray, Place 2 sprays into both nostrils 4 (four) times daily. As needed for nasal congestion, runny nose, Disp: 15 mL, Rfl: 0 ?  tiZANidine (ZANAFLEX) 4 MG tablet, tizanidine 4 mg tablet  TAKE ONE TABLET BY MOUTH TWICE DAILY, Disp: , Rfl:  ?  amitriptyline (ELAVIL) 25 MG tablet, Take 25 mg by mouth at bedtime., Disp: , Rfl:  ?  cetirizine (ZYRTEC ALLERGY) 10 MG tablet, Take 1 tablet (10 mg total) by mouth at bedtime., Disp: 30 tablet, Rfl: 0 ?  meclizine (ANTIVERT) 25 MG tablet, Take 1 tablet (25 mg total) by mouth 3 (three) times daily as needed for dizziness., Disp: 30 tablet, Rfl: 0 ?Allergies  ?Allergen Reactions  ? Tramadol Hcl Itching and Nausea Only  ? Latex Itching and Rash  ?  ?Social History  ? ?Tobacco Use  ? Smoking status: Never  ? Smokeless tobacco: Never  ?Substance Use Topics  ? Alcohol use: Yes  ?  Comment: rarely  ?  ?Family History  ?Problem Relation Age of Onset  ? Colon cancer Other   ? Anesthesia problems Neg Hx   ? Hypotension Neg Hx   ? Malignant hyperthermia Neg Hx   ? Pseudochol  deficiency Neg Hx   ? Esophageal cancer Neg Hx   ? Rectal cancer Neg Hx   ? Stomach cancer Neg Hx   ?  ? ? ?Review of Systems ? ?Constitutional: negative for fatigue and weight loss ?Respiratory: negative for cough and wheezing ?Cardiovascular: negative for chest pain, fatigue and palpitations ?Gastrointestinal: negative for abdominal pain and change in bowel habits ?Musculoskeletal:negative for myalgias ?Neurological: negative for gait problems and tremors ?Behavioral/Psych: negative for abusive relationship, depression ?Endocrine: negative for temperature intolerance    ?Genitourinary:negative for abnormal menstrual periods, genital lesions, hot flashes, sexual problems and vaginal discharge ?Integument/breast: negative for breast lump, breast tenderness, nipple discharge and skin lesion(s) ? ?  ?Objective:  ? ?    ?BP (!) 147/79   Pulse (!) 101   Ht '5\' 6"'$  (1.676 m)   Wt 182 lb 3.2 oz (82.6 kg)   BMI 29.41 kg/m?  ?General:   Alert and no distress  ?Skin:   no rash or abnormalities  ?Lungs:   clear to auscultation bilaterally  ?Heart:   regular rate and rhythm, S1, S2 normal, no murmur, click, rub or gallop  ?Breasts:   normal without suspicious masses, skin or nipple changes or axillary nodes  ?Abdomen:  normal findings: no organomegaly, soft, non-tender and no hernia  ?Pelvis:  External genitalia: normal general appearance ?Urinary system: urethral meatus normal and bladder without fullness, nontender ?Vaginal: normal without tenderness, induration or masses ?Cervix: normal appearance ?Adnexa: normal bimanual exam ?Uterus: anteverted and non-tender, normal size  ? ?Lab Review ?Urine pregnancy test ?Labs reviewed yes ?Radiologic studies reviewed yes ? ?I have spent a total of 20 minutes of face-to-face and time, excluding clinical staff time, reviewing notes and preparing to see patient, ordering tests and/or medications, and counseling the patient.  ? ?Assessment:  ? ?1. Encounter for gynecological  examination with Papanicolaou smear of cervix ?Rx: ?- Cytology - PAP( [facility]) ? ?2. Postmenopause ?- doing well ? ? ?Plan:  ? ? Education reviewed: calcium supplements, depression evaluation, low fat, low cholesterol diet, safe sex/STD prevention, self breast exams, and weight bearing exercise. ?Follow up in: 1 year.  ? ? ? Shelly Bombard, MD ?11/01/2021 4:05 AM  ?

## 2021-10-31 NOTE — Progress Notes (Signed)
Pt in office for annual exam. Pt does not have any concerns at this time.  ? ?Last PAP: 09-2020 ?Last Mammogram: 10-2021 ?Next colonoscopy: 2024 ? ?

## 2021-11-04 LAB — CYTOLOGY - PAP
Comment: NEGATIVE
Diagnosis: NEGATIVE
High risk HPV: NEGATIVE

## 2021-11-17 ENCOUNTER — Ambulatory Visit (INDEPENDENT_AMBULATORY_CARE_PROVIDER_SITE_OTHER): Payer: Managed Care, Other (non HMO)

## 2021-11-17 ENCOUNTER — Ambulatory Visit: Payer: Managed Care, Other (non HMO) | Admitting: Podiatry

## 2021-11-17 ENCOUNTER — Encounter: Payer: Self-pay | Admitting: Podiatry

## 2021-11-17 DIAGNOSIS — M7671 Peroneal tendinitis, right leg: Secondary | ICD-10-CM

## 2021-11-17 DIAGNOSIS — M779 Enthesopathy, unspecified: Secondary | ICD-10-CM | POA: Diagnosis not present

## 2021-11-17 MED ORDER — TRIAMCINOLONE ACETONIDE 10 MG/ML IJ SUSP
10.0000 mg | Freq: Once | INTRAMUSCULAR | Status: AC
  Administered 2021-11-17: 10 mg

## 2021-11-18 NOTE — Progress Notes (Signed)
Subjective:  ? ?Patient ID: Teresa Mcmahon, female   DOB: 61 y.o.   MRN: [id number]  ? ?HPI ?Patient presents with a lot of pain in the outside of the right foot stating that it is hard for her to walk or be comfortable ? ? ?ROS ? ? ?   ?Objective:  ?Physical Exam  ?Neurovascular status intact with inflammation pain at the peroneal fifth metatarsal base with fluid buildup no indication of tendon dysfunction ? ?   ?Assessment:  ?Acute inflammation of the peroneal insertion base of fifth metatarsal right ? ?   ?Plan:  ?Sterile prep and injected the base after discussing risk with careful 3 mg dexamethasone Kenalog 5 mg Xylocaine and advised on support shoes and reduced activity.  Reappoint if symptoms indicate ?   ? ? ?

## 2021-11-19 ENCOUNTER — Encounter: Payer: Self-pay | Admitting: Obstetrics and Gynecology

## 2021-11-24 ENCOUNTER — Encounter: Payer: Self-pay | Admitting: Physician Assistant

## 2021-11-24 NOTE — Progress Notes (Signed)
? ?New Patient Office Visit ? ?Subjective:  ?Patient ID: Teresa Mcmahon, female    DOB: 1961/02/02  Age: 61 y.o. MRN: [id number] ? ?CC:  ?Chief Complaint  ?Patient presents with  ? Medication Refill  ?  New pt get est. And will need refills on meds in the future.  ? ? ?HPI ?Teresa Mcmahon presents to establish care; reports that she had an annual exam in 05/2021 but is here to get established because she had to change PCPs due to her medical insurance; Denies chest pain, shortness of breath, palpitations, or headache.  ? ? ? ?Outpatient Encounter Medications as of 11/25/2021  ?Medication Sig  ? albuterol (VENTOLIN HFA) 108 (90 Base) MCG/ACT inhaler ProAir HFA 90 mcg/actuation aerosol inhaler  ? amLODipine (NORVASC) 10 MG tablet Take 1 tablet (10 mg total) by mouth daily.  ? ergocalciferol (VITAMIN D2) 1.25 MG (50000 UT) capsule ergocalciferol (vitamin D2) 1,250 mcg (50,000 unit) capsule ? TAKE ONE CAPSULE BY MOUTH Once weekly  ? esomeprazole (NEXIUM) 40 MG capsule esomeprazole magnesium 40 mg capsule,delayed release ? TAKE ONE CAPSULE BY MOUTH EVERY DAY  ? fluticasone (FLONASE) 50 MCG/ACT nasal spray Place 2 sprays into both nostrils daily.  ? Ibuprofen (MOTRIN PO) Take by mouth.  ? ipratropium (ATROVENT) 0.06 % nasal spray Place 2 sprays into both nostrils 4 (four) times daily. As needed for nasal congestion, runny nose  ? tiZANidine (ZANAFLEX) 4 MG tablet tizanidine 4 mg tablet ? TAKE ONE TABLET BY MOUTH TWICE DAILY  ? [DISCONTINUED] busPIRone (BUSPAR) 5 MG tablet Take 5 mg by mouth 3 (three) times daily.  ? busPIRone (BUSPAR) 5 MG tablet Take 1 tablet (5 mg total) by mouth at bedtime as needed.  ? cetirizine (ZYRTEC ALLERGY) 10 MG tablet Take 1 tablet (10 mg total) by mouth at bedtime.  ? [DISCONTINUED] amitriptyline (ELAVIL) 25 MG tablet Take 25 mg by mouth at bedtime.  ? [DISCONTINUED] amLODipine (NORVASC) 5 MG tablet Take 1 tablet (5 mg total) by mouth daily. (Patient taking differently: Take 10  mg by mouth daily.)  ? [DISCONTINUED] meclizine (ANTIVERT) 25 MG tablet Take 1 tablet (25 mg total) by mouth 3 (three) times daily as needed for dizziness.  ? [DISCONTINUED] montelukast (SINGULAIR) 10 MG tablet Take 10 mg by mouth at bedtime.  ? [DISCONTINUED] traZODone (DESYREL) 100 MG tablet at bedtime.  ? ?No facility-administered encounter medications on file as of 11/25/2021.  ? ? ?Past Medical History:  ?Diagnosis Date  ? Aftercare 06/14/2018  ? Allergy   ? Anemia   ? Anxiety   ? Arthritis   ? Asthma   ? Carpal tunnel syndrome   ? GASTROESOPHAGEAL REFLUX DISEASE 05/11/2007  ? Qualifier: Diagnosis of  By: Radene Ou MD, [place]    ? GERD (gastroesophageal reflux disease)   ? LARYNGITIS, ACUTE 08/27/2008  ? Qualifier: Diagnosis of  By: Isla Pence    ? PHARYNGITIS, VIRAL 03/03/2010  ? Qualifier: Diagnosis of  By: Alvy Beal    ? Shortness of breath   ? 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 Qualifier: Diagnosis of  By: Hassell Done FNP, Tori Milks    ? URI 05/29/2009  ? Qualifier: Diagnosis of  By: Jorene Minors, [place]    ? ? ?Past Surgical History:  ?Procedure Laterality Date  ? ANTERIOR CRUCIATE LIGAMENT REPAIR Left   ? 2018  ? BACK SURGERY    ? COLONOSCOPY  07/20/2011  ? Procedure: COLONOSCOPY;  Surgeon: Jeryl Columbia, MD;  Location: [facility] ENDOSCOPY;  Service: Endoscopy;  Laterality: N/A;  ? ENDOMETRIAL ABLATION  2006  ? NECK SURGERY    ? x3  ? ? ?Family History  ?Problem Relation Age of Onset  ? Colon cancer Other   ? Anesthesia problems Neg Hx   ? Hypotension Neg Hx   ? Malignant hyperthermia Neg Hx   ? Pseudochol deficiency Neg Hx   ? Esophageal cancer Neg Hx   ? Rectal cancer Neg Hx   ? Stomach cancer Neg Hx   ? ? ?Social History  ? ?Socioeconomic History  ? Marital status: Married  ?  Spouse name: Not on file  ? Number of children: Not on file  ? Years of education: Not on file  ? Highest education level: Not on file  ?Occupational History  ? Not on file  ?Tobacco Use  ? Smoking status: Never  ? Smokeless tobacco: Never   ?Vaping Use  ? Vaping Use: Never used  ?Substance and Sexual Activity  ? Alcohol use: Yes  ?  Comment: rarely  ? Drug use: No  ? Sexual activity: Yes  ?  Birth control/protection: Post-menopausal  ?Other Topics Concern  ? Not on file  ?Social History Narrative  ? Not on file  ? ?Social Determinants of Health  ? ?Financial Resource Strain: Not on file  ?Food Insecurity: Not on file  ?Transportation Needs: Not on file  ?Physical Activity: Not on file  ?Stress: Not on file  ?Social Connections: Not on file  ?Intimate Partner Violence: Not on file  ? ? ?ROS ?Review of Systems  ?Constitutional:  Negative for activity change and chills.  ?HENT:  Negative for congestion and voice change.   ?Eyes:  Negative for pain and redness.  ?Respiratory:  Negative for cough and wheezing.   ?Cardiovascular:  Negative for chest pain.  ?Gastrointestinal:  Negative for constipation, diarrhea, nausea and vomiting.  ?Endocrine: Negative for polyuria.  ?Genitourinary:  Negative for frequency.  ?Skin:  Negative for color change and rash.  ?Allergic/Immunologic: Negative for immunocompromised state.  ?Neurological:  Negative for dizziness.  ?Psychiatric/Behavioral:  Negative for agitation.   ? ?Objective:  ? ?Today's Vitals: BP 140/80   Pulse 66   Ht '5\' 6"'$  (1.676 m)   Wt 183 lb 12.8 oz (83.4 kg)   SpO2 98%   BMI 29.67 kg/m?  ? ?Physical Exam ?Vitals and nursing note reviewed.  ?Constitutional:   ?   General: She is not in acute distress. ?   Appearance: Normal appearance. She is not ill-appearing.  ?HENT:  ?   Head: Normocephalic and atraumatic.  ?   Right Ear: External ear normal.  ?   Left Ear: External ear normal.  ?   Nose: No congestion.  ?Eyes:  ?   Extraocular Movements: Extraocular movements intact.  ?   Conjunctiva/sclera: Conjunctivae normal.  ?   Pupils: Pupils are equal, round, and reactive to light.  ?Cardiovascular:  ?   Rate and Rhythm: Normal rate and regular rhythm.  ?   Pulses: Normal pulses.  ?   Heart sounds: Normal  heart sounds.  ?Pulmonary:  ?   Effort: Pulmonary effort is normal.  ?   Breath sounds: Normal breath sounds. No wheezing.  ?Abdominal:  ?   General: Bowel sounds are normal.  ?   Palpations: Abdomen is soft.  ?Musculoskeletal:     ?   General: Normal range of motion.  ?   Cervical back: Normal range of motion and neck supple.  ?   Right lower leg:  No edema.  ?   Left lower leg: No edema.  ?Skin: ?   General: Skin is warm and dry.  ?   Findings: No bruising.  ?Neurological:  ?   General: No focal deficit present.  ?   Mental Status: She is alert and oriented to person, place, and time.  ?Psychiatric:     ?   Mood and Affect: Mood normal.     ?   Behavior: Behavior normal.     ?   Thought Content: Thought content normal.  ? ? ? ? ?Assessment & Plan:  ? ?Problem List Items Addressed This Visit   ? ?  ? Cardiovascular and Mediastinum  ? Primary hypertension - Primary  ? Relevant Medications  ? amLODipine (NORVASC) 10 MG tablet  ?  ? Other  ? ANXIETY DEPRESSION  ? Relevant Medications  ? busPIRone (BUSPAR) 5 MG tablet  ? ? ?Follow-up: Return in about 6 months (around 05/29/2022) for Return for Annual Exam with PCP Jimmye Norman.  ? ?Irene Pap, PA-C ?

## 2021-11-25 ENCOUNTER — Encounter: Payer: Self-pay | Admitting: Physician Assistant

## 2021-11-25 ENCOUNTER — Ambulatory Visit: Payer: Managed Care, Other (non HMO) | Admitting: Physician Assistant

## 2021-11-25 VITALS — BP 140/80 | HR 66 | Ht 66.0 in | Wt 183.8 lb

## 2021-11-25 DIAGNOSIS — I10 Essential (primary) hypertension: Secondary | ICD-10-CM | POA: Insufficient documentation

## 2021-11-25 DIAGNOSIS — K219 Gastro-esophageal reflux disease without esophagitis: Secondary | ICD-10-CM | POA: Insufficient documentation

## 2021-11-25 DIAGNOSIS — F341 Dysthymic disorder: Secondary | ICD-10-CM | POA: Diagnosis not present

## 2021-11-25 MED ORDER — BUSPIRONE HCL 5 MG PO TABS: 5.0000 mg | ORAL_TABLET | Freq: Every evening | ORAL | 0 refills | Status: DC | PRN

## 2021-11-25 MED ORDER — AMLODIPINE BESYLATE 10 MG PO TABS: 10.0000 mg | ORAL_TABLET | Freq: Every day | ORAL | 0 refills | Status: DC

## 2021-11-25 NOTE — Patient Instructions (Signed)
Eat a low salt diet and minimize processed foods (examples canned vegetables, lunch meats, fast food), drink 8 glasses of water a day, exercise 3 - 5 times a week (example walking 30 minutes daily), decrease alcohol intake, decrease smoking or nicotine use, decrease caffeine intake, check blood pressure at home if you have a machine. ? ?

## 2021-11-26 ENCOUNTER — Encounter: Payer: Self-pay | Admitting: Physician Assistant

## 2021-11-26 NOTE — Assessment & Plan Note (Signed)
controlled, continue amlodipine 10 mg qd, eat a low salt diet, do not add any salt to food when cooking, avoid processed foods, avoid fried foods ? ?

## 2021-11-26 NOTE — Assessment & Plan Note (Signed)
Stable, continue buspar 5 mg hs ?

## 2022-01-14 ENCOUNTER — Ambulatory Visit: Payer: Managed Care, Other (non HMO) | Admitting: Obstetrics

## 2022-02-03 ENCOUNTER — Encounter (HOSPITAL_COMMUNITY): Payer: Self-pay

## 2022-02-03 ENCOUNTER — Emergency Department (HOSPITAL_COMMUNITY): Payer: Commercial Managed Care - HMO

## 2022-02-03 ENCOUNTER — Emergency Department (HOSPITAL_COMMUNITY)
Admission: EM | Admit: 2022-02-03 | Discharge: 2022-02-03 | Disposition: A | Discharge status: Home / Self Care | Payer: Commercial Managed Care - HMO | Attending: Emergency Medicine | Admitting: Emergency Medicine

## 2022-02-03 ENCOUNTER — Ambulatory Visit (HOSPITAL_COMMUNITY)
Admission: EM | Admit: 2022-02-03 | Discharge: 2022-02-03 | Disposition: A | Discharge status: Other Acute Inpatient Hospital | Payer: Commercial Managed Care - HMO

## 2022-02-03 ENCOUNTER — Other Ambulatory Visit: Payer: Self-pay

## 2022-02-03 DIAGNOSIS — Z91040 Latex allergy status: Secondary | ICD-10-CM | POA: Diagnosis not present

## 2022-02-03 DIAGNOSIS — R1031 Right lower quadrant pain: Secondary | ICD-10-CM | POA: Diagnosis not present

## 2022-02-03 DIAGNOSIS — K529 Noninfective gastroenteritis and colitis, unspecified: Secondary | ICD-10-CM | POA: Insufficient documentation

## 2022-02-03 LAB — URINALYSIS, ROUTINE W REFLEX MICROSCOPIC
Bilirubin Urine: NEGATIVE
Glucose, UA: NEGATIVE mg/dL
Hgb urine dipstick: NEGATIVE
Ketones, ur: NEGATIVE mg/dL
Leukocytes,Ua: NEGATIVE
Nitrite: NEGATIVE
Protein, ur: NEGATIVE mg/dL
Specific Gravity, Urine: 1.027 (ref 1.005–1.030)
pH: 6 (ref 5.0–8.0)

## 2022-02-03 LAB — COMPREHENSIVE METABOLIC PANEL
ALT: 24 U/L (ref 0–44)
AST: 30 U/L (ref 15–41)
Albumin: 4.1 g/dL (ref 3.5–5.0)
Alkaline Phosphatase: 70 U/L (ref 38–126)
Anion gap: 8 (ref 5–15)
BUN: 13 mg/dL (ref 8–23)
CO2: 23 mmol/L (ref 22–32)
Calcium: 9.8 mg/dL (ref 8.9–10.3)
Chloride: 106 mmol/L (ref 98–111)
Creatinine, Ser: 0.97 mg/dL (ref 0.44–1.00)
GFR, Estimated: >60 mL/min (ref >60)
Glucose, Bld: 99 mg/dL (ref 70–99)
Potassium: 4.1 mmol/L (ref 3.5–5.1)
Sodium: 137 mmol/L (ref 135–145)
Total Bilirubin: 0.4 mg/dL (ref 0.3–1.2)
Total Protein: 7.8 g/dL (ref 6.5–8.1)

## 2022-02-03 LAB — CBC
HCT: 34.7 % — ABNORMAL LOW (ref 36.0–46.0)
Hemoglobin: 10.8 g/dL — ABNORMAL LOW (ref 12.0–15.0)
MCH: 22.8 pg — ABNORMAL LOW (ref 26.0–34.0)
MCHC: 31.1 g/dL (ref 30.0–36.0)
MCV: 73.2 fL — ABNORMAL LOW (ref 80.0–100.0)
Platelets: 253 10*3/uL (ref 150–400)
RBC: 4.74 MIL/uL (ref 3.87–5.11)
RDW: 15.2 % (ref 11.5–15.5)
WBC: 8.3 10*3/uL (ref 4.0–10.5)
nRBC: 0 % (ref 0.0–0.2)

## 2022-02-03 LAB — LIPASE, BLOOD: Lipase: 32 U/L (ref 11–51)

## 2022-02-03 MED ORDER — SODIUM CHLORIDE 0.9 % IV BOLUS
1000.0000 mL | Freq: Once | INTRAVENOUS | Status: AC
  Administered 2022-02-03: 1000 mL via INTRAVENOUS

## 2022-02-03 MED ORDER — ONDANSETRON HCL 4 MG/2ML IJ SOLN
4.0000 mg | Freq: Once | INTRAMUSCULAR | Status: AC
  Administered 2022-02-03: 4 mg via INTRAVENOUS
  Filled 2022-02-03: qty 2

## 2022-02-03 MED ORDER — SODIUM CHLORIDE 0.9 % IV SOLN
2.0000 g | Freq: Once | INTRAVENOUS | Status: AC
  Administered 2022-02-03: 2 g via INTRAVENOUS
  Filled 2022-02-03: qty 20

## 2022-02-03 MED ORDER — IOHEXOL 300 MG/ML  SOLN
100.0000 mL | Freq: Once | INTRAMUSCULAR | Status: AC | PRN
Start: 2022-02-03 — End: 2022-02-03
  Administered 2022-02-03: 100 mL via INTRAVENOUS

## 2022-02-03 MED ORDER — OXYCODONE-ACETAMINOPHEN 5-325 MG PO TABS: 1.0000 | ORAL_TABLET | Freq: Once | ORAL | Status: DC

## 2022-02-03 MED ORDER — ONDANSETRON 4 MG PO TBDP: 4.0000 mg | ORAL_TABLET | Freq: Three times a day (TID) | ORAL | 0 refills | Status: DC | PRN

## 2022-02-03 MED ORDER — METRONIDAZOLE 500 MG PO TABS: 500.0000 mg | ORAL_TABLET | Freq: Two times a day (BID) | ORAL | 0 refills | Status: DC

## 2022-02-03 MED ORDER — CIPROFLOXACIN HCL 500 MG PO TABS: 500.0000 mg | ORAL_TABLET | Freq: Two times a day (BID) | ORAL | 0 refills | Status: DC

## 2022-02-03 MED ORDER — METRONIDAZOLE 500 MG/100ML IV SOLN
500.0000 mg | Freq: Once | INTRAVENOUS | Status: AC
  Administered 2022-02-03: 500 mg via INTRAVENOUS
  Filled 2022-02-03: qty 100

## 2022-02-03 MED ORDER — MORPHINE SULFATE (PF) 4 MG/ML IV SOLN
4.0000 mg | Freq: Once | INTRAVENOUS | Status: AC
  Administered 2022-02-03: 4 mg via INTRAVENOUS
  Filled 2022-02-03: qty 1

## 2022-02-03 MED ORDER — OXYCODONE HCL 5 MG PO TABS: 5.0000 mg | ORAL_TABLET | ORAL | 0 refills | Status: DC | PRN

## 2022-02-03 MED ORDER — ONDANSETRON 4 MG PO TBDP: 4.0000 mg | ORAL_TABLET | Freq: Once | ORAL | Status: DC

## 2022-02-03 NOTE — ED Triage Notes (Signed)
Reports intermittent abd pain x 2 weeks.  Was sent by UC to rule out appendix.  Patient reports nausea.  Reports pain in right lower quad now moving to mid abd

## 2022-02-03 NOTE — ED Provider Notes (Addendum)
[facility] EMERGENCY DEPARTMENT Provider Note   CSN: [id number] Arrival date & time: 02/03/22  1541     History  Chief Complaint  Patient presents with   Abdominal Pain    Teresa Mcmahon is a 61 y.o. female here for evaluation of abdominal pain.  Began 2 weeks ago.  Described as right lower abdominal pain which radiates up and over to her umbilicus.  No history of similar.  Describes pain as intermittent however has a constant aspect.  She is of some nausea without vomiting.  Last bowel movement today without any bright red blood per rectum, melena.  No diarrhea possibly slightly looser stools than normal.  No recent antibiotics or travel.  Pain is not worse with food intake.  No dysuria, hematuria, pelvic pain or vaginal discharge.  She was seen and had a colonoscopy in 2020 where she had some polyps removed however no prior history of cancer, IBD.  Pain does not go into her flank, chest or back.  No meds PTA.  Pain worsens when needing to have a bowel movement.  HPI     Home Medications Prior to Admission medications   Medication Sig Start Date End Date Taking? Authorizing Provider  albuterol (VENTOLIN HFA) 108 (90 Base) MCG/ACT inhaler ProAir HFA 90 mcg/actuation aerosol inhaler    [provider]  amLODipine (NORVASC) 10 MG tablet Take 1 tablet (10 mg total) by mouth daily. 11/25/21   Irene Pap, PA-C  busPIRone (BUSPAR) 5 MG tablet Take 1 tablet (5 mg total) by mouth at bedtime as needed. 11/25/21   Irene Pap, PA-C  cetirizine (ZYRTEC ALLERGY) 10 MG tablet Take 1 tablet (10 mg total) by mouth at bedtime. 07/13/21 08/12/21  Lynden Oxford Scales, PA-C  ciprofloxacin (CIPRO) 500 MG tablet Take 1 tablet (500 mg total) by mouth every 12 (twelve) hours. 02/03/22  Yes Krisha Beegle A, PA-C  ergocalciferol (VITAMIN D2) 1.25 MG (50000 UT) capsule ergocalciferol (vitamin D2) 1,250 mcg (50,000 unit) capsule  TAKE ONE CAPSULE BY MOUTH Once  weekly    [provider]  esomeprazole (NEXIUM) 40 MG capsule esomeprazole magnesium 40 mg capsule,delayed release  TAKE ONE CAPSULE BY MOUTH EVERY DAY    [provider]  fluticasone (FLONASE) 50 MCG/ACT nasal spray Place 2 sprays into both nostrils daily. 07/13/21   Lynden Oxford Scales, PA-C  Ibuprofen (MOTRIN PO) Take by mouth.    [provider]  ipratropium (ATROVENT) 0.06 % nasal spray Place 2 sprays into both nostrils 4 (four) times daily. As needed for nasal congestion, runny nose 07/13/21   Lynden Oxford Scales, PA-C  metroNIDAZOLE (FLAGYL) 500 MG tablet Take 1 tablet (500 mg total) by mouth 2 (two) times daily. 02/03/22  Yes Roshawn Ayala A, PA-C  ondansetron (ZOFRAN-ODT) 4 MG disintegrating tablet Take 1 tablet (4 mg total) by mouth every 8 (eight) hours as needed for nausea or vomiting. 02/03/22  Yes Marvelle Caudill A, PA-C  oxyCODONE (ROXICODONE) 5 MG immediate release tablet Take 1 tablet (5 mg total) by mouth every 4 (four) hours as needed for severe pain. 02/03/22  Yes Garnell Phenix A, PA-C  tiZANidine (ZANAFLEX) 4 MG tablet tizanidine 4 mg tablet  TAKE ONE TABLET BY MOUTH TWICE DAILY    [provider]  montelukast (SINGULAIR) 10 MG tablet Take 10 mg by mouth at bedtime.  02/16/19  [provider]  traZODone (DESYREL) 100 MG tablet at bedtime.  02/16/19  [provider]  Allergies    Tramadol hcl and Latex    Review of Systems   Review of Systems  Constitutional: Negative.   HENT: Negative.    Respiratory: Negative.    Cardiovascular: Negative.   Gastrointestinal:  Positive for abdominal pain, diarrhea and nausea. Negative for abdominal distention, anal bleeding, blood in stool, rectal pain and vomiting.  Genitourinary: Negative.   Musculoskeletal: Negative.   Skin: Negative.   Neurological: Negative.   All other systems reviewed and are negative.   Physical Exam Updated Vital Signs BP 137/68   Pulse 77    Temp 99.1 F (37.3 C) (Oral)   Resp 11   Ht '5\' 6"'$  (1.676 m)   Wt 83 kg   SpO2 100%   BMI 29.54 kg/m  Physical Exam Vitals and nursing note reviewed.  Constitutional:      General: She is not in acute distress.    Appearance: She is well-developed. She is not ill-appearing.  HENT:     Head: Normocephalic and atraumatic.  Eyes:     Pupils: Pupils are equal, round, and reactive to light.  Cardiovascular:     Rate and Rhythm: Normal rate.     Heart sounds: Normal heart sounds.  Pulmonary:     Effort: Pulmonary effort is normal. No respiratory distress.     Breath sounds: Normal breath sounds.  Abdominal:     General: Abdomen is flat. Bowel sounds are normal. There is no distension.     Palpations: Abdomen is soft.     Tenderness: There is generalized abdominal tenderness. There is no right CVA tenderness, left CVA tenderness, guarding or rebound. Negative signs include Murphy's sign and McBurney's sign.     Comments: Generalized tenderness worse to right mid and lower abdomen as well as umbilical region.  No rebound or guarding.  Negative Murphy sign, McBurney point  Musculoskeletal:        General: Normal range of motion.     Cervical back: Normal range of motion.  Skin:    General: Skin is warm and dry.  Neurological:     General: No focal deficit present.     Mental Status: She is alert.  Psychiatric:        Mood and Affect: Mood normal.     ED Results / Procedures / Treatments   Labs (all labs ordered are listed, but only abnormal results are displayed) Labs Reviewed  CBC - Abnormal; Notable for the following components:      Result Value   Hemoglobin 10.8 (*)    HCT 34.7 (*)    MCV 73.2 (*)    MCH 22.8 (*)    All other components within normal limits  LIPASE, BLOOD  COMPREHENSIVE METABOLIC PANEL  URINALYSIS, ROUTINE W REFLEX MICROSCOPIC    EKG None  Radiology CT Abdomen Pelvis W Contrast  Result Date: 02/03/2022 CLINICAL DATA:  Right lower quadrant  abdominal pain. EXAM: CT ABDOMEN AND PELVIS WITH CONTRAST TECHNIQUE: Multidetector CT imaging of the abdomen and pelvis was performed using the standard protocol following bolus administration of intravenous contrast. RADIATION DOSE REDUCTION: This exam was performed according to the departmental dose-optimization program which includes automated exposure control, adjustment of the mA and/or kV according to patient size and/or use of iterative reconstruction technique. CONTRAST:  138m OMNIPAQUE IOHEXOL 300 MG/ML  SOLN COMPARISON:  Pelvic ultrasound 09/23/2020 FINDINGS: Lower chest: No focal airspace disease or pleural effusion. The heart is normal in size. Hepatobiliary: No focal liver abnormality. Elongated left lobe of  the liver, normal variant. Overall normal liver size and density. Gallbladder is only minimally distended. No calcified gallstone. No pericholecystic fat stranding. No biliary dilatation. Pancreas: No ductal dilatation or inflammation. Spleen: Normal in size without focal abnormality. Adrenals/Urinary Tract: Normal adrenal glands. No hydronephrosis or perinephric edema. Homogeneous renal enhancement with symmetric excretion on delayed phase imaging. No renal calculi or focal renal lesion. Urinary bladder is physiologically distended without wall thickening. Stomach/Bowel: Normal air-filled appendix courses into the right upper quadrant under the right hepatic lobe. There is mild wall thickening and pericolonic edema involving the cecum and moderate length ascending colon. No pneumatosis. No other sites of bowel wall thickening or inflammatory change. Small bowel approaches a wide necked umbilical hernia but does not extend into the hernia sac. Small to moderate stool burden the more distal colon. No significant diverticular disease ingested material distends the stomach without gastric inflammation. Vascular/Lymphatic: Normal caliber abdominal aorta. Patent portal vein no acute vascular findings.  Scattered lymph nodes in the abdomen and pelvis are not enlarged by size criteria, no suspicious adenopathy. Reproductive: Small uterine fibroid on prior pelvic ultrasound is not well seen on the current exam. There is no adnexal mass. Other: No free air, free fluid, or intra-abdominal fluid collection. Small to moderate size minimally complex fat containing umbilical hernia. Small bowel loops approach the hernia neck but do not extend into the hernia sac. Musculoskeletal: Lower lumbar degenerative disc disease and facet hypertrophy. 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 No focal bone lesion. IMPRESSION: 1. Mild wall thickening and pericolonic edema involving the cecum and moderate length ascending colon, suspicious for colitis. This may be infectious or inflammatory. Recommend up-to-date colonoscopy to exclude the remote possibility of underlying colonic neoplasm. 2. Normal appendix. 3. Small to moderate size minimally complex fat containing umbilical hernia. Small bowel loops approach the hernia neck but do not extend into the hernia sac. 4. 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 Electronically Signed   By: Keith Rake M.D.   On: 02/03/2022 20:52    Procedures Procedures    Medications Ordered in ED Medications  cefTRIAXone (ROCEPHIN) 2 g in sodium chloride 0.9 % 100 mL IVPB (0 g Intravenous Stopped 02/03/22 2225)    And  metroNIDAZOLE (FLAGYL) IVPB 500 mg (500 mg Intravenous New Bag/Given 02/03/22 2232)  iohexol (OMNIPAQUE) 300 MG/ML solution 100 mL (100 mLs Intravenous Contrast Given 02/03/22 2031)  sodium chloride 0.9 % bolus 1,000 mL (0 mLs Intravenous Stopped 02/03/22 2231)  ondansetron (ZOFRAN) injection 4 mg (4 mg Intravenous Given 02/03/22 2123)  morphine (PF) 4 MG/ML injection 4 mg (4 mg Intravenous Given 02/03/22 2124)    ED Course/ Medical Decision Making/ A&P    61 year old history of GERD here for evaluation of abdominal pain over the  last 2 weeks.  Initially admitted however now constant.  Started as generalized as well as to right mid and lower abdomen.  Some possibly looser stools however denies any frank diarrhea.  No bloody stools.  Does not go to back.  No urinary symptoms.  Pain not worse with food intake.  Last colonoscopy 3 years ago with some polyp removals.  Plan on labs, imaging and reassess  Labs and imaging personally viewed and interpreted:  UA negative for infection Lipase 32 CBC without leukocytosis, hemoglobin 98.3 Metabolic panel not significant abnormality CT scan with infectious versus inflammatory colitis of the ascending colon.  Normal appendix.  Patient reassessed.  Her CT scan correlates with her locations  of pain.  We will treat with antibiotics and pain management.  She denies any blood in her stool have low suspicion for ischemic colitis.  Discussed results with patient.   Patient reassessed.  Pain improved, tolerating p.o. intake.  Will write for antibiotics for colitis.  Her abdominal pain is not suspicious for mesenteric ischemia, AAA, dissection, perforated bowel, acute cholecystitis, symptomatic cholelithiasis, TOA, torsion, PID.  We will have her follow-up with gastroenterology outpatient, Rx for antibiotics to cover for colitis   The patient has been appropriately medically screened and/or stabilized in the ED. I have low suspicion for any other emergent medical condition which would require further screening, evaluation or treatment in the ED or require inpatient management.  Patient is hemodynamically stable and in no acute distress.  Patient able to ambulate in department prior to ED.  Evaluation does not show acute pathology that would require ongoing or additional emergent interventions while in the emergency department or further inpatient treatment.  I have discussed the diagnosis with the patient and answered all questions.  Pain is been managed while in the emergency department and  patient has no further complaints prior to discharge.  Patient is comfortable with plan discussed in room and is stable for discharge at this time.  I have discussed strict return precautions for returning to the emergency department.  Patient was encouraged to follow-up with PCP/specialist refer to at discharge.                           Medical Decision Making Amount and/or Complexity of Data Reviewed Independent Historian: friend External Data Reviewed: labs, radiology and notes. Labs: ordered. Decision-making details documented in ED Course. Radiology: ordered and independent interpretation performed. Decision-making details documented in ED Course.  Risk OTC drugs. Prescription drug management. Parenteral controlled substances. Decision regarding hospitalization. Diagnosis or treatment significantly limited by social determinants of health.          Final Clinical Impression(s) / ED Diagnoses Final diagnoses:  Colitis    Rx / DC Orders ED Discharge Orders          Ordered    ciprofloxacin (CIPRO) 500 MG tablet  Every 12 hours        02/03/22 2316    metroNIDAZOLE (FLAGYL) 500 MG tablet  2 times daily        02/03/22 2316    oxyCODONE (ROXICODONE) 5 MG immediate release tablet  Every 4 hours PRN        02/03/22 2316    ondansetron (ZOFRAN-ODT) 4 MG disintegrating tablet  Every 8 hours PRN        02/03/22 2316               Windie Marasco A, PA-C 02/03/22 2322    Regan Lemming, MD 02/04/22 [phone]

## 2022-02-03 NOTE — ED Provider Notes (Signed)
[facility]    CSN: [id number] Arrival date & time: 02/03/22  1408      History   Chief Complaint Chief Complaint  Patient presents with   Abdominal Pain    HPI Teresa Mcmahon is a 61 y.o. female presenting with abd pain x2 weeks. History GERD.  Describes severe right-sided abdominal pain for 2 weeks, getting worse.  At time of visit, she is having spells of severe abdominal pain every 45 minutes, states it feels like her umbilicus is being pulled to the RLQ.  This is associated with nausea but no vomiting.  Last bowel movement was this morning and was normal, denies black or tarry stool, BRBPR.  She denies prior abdominal procedures, still has her appendix and gallbladder.  Last colonoscopy was 2020 and showed a polyp but otherwise normal per pt. No recent abx.  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   Qualifier: Diagnosis of  By: Daphine Deutscher FNP, [place]     URI 05/29/2009   Qualifier: Diagnosis of  By: Huntley Dec, Scott      Patient Active Problem List   Diagnosis Date Noted   GERD without esophagitis 11/25/2021   Primary hypertension 11/25/2021   Carpal tunnel syndrome of left wrist 10/29/2020   Carpal tunnel syndrome of right wrist 10/29/2020   Trigger thumb of left hand 12/23/2017   Osteoarthritis of carpometacarpal (CMC) joint of thumb 10/08/2017   Arthritis of carpometacarpal (CMC) joint of right thumb 10/08/2017   Status post arthroscopy of left knee 06/15/2016   Cervical spondylosis with  myelopathy 06/05/2015   TENOSYNOVITIS, WRIST 06/03/2010   WRIST PAIN, LEFT 05/02/2010   ANEMIA, DEFICIENCY, HX OF 11/11/2009   UNSPECIFIED GLAUCOMA 10/24/2008   Unspecified glaucoma 10/24/2008   ASTHMA 08/27/2008   ANXIETY DEPRESSION 05/11/2007   HEADACHE, TENSION 05/11/2007    Past Surgical History:  Procedure Laterality Date   ANTERIOR CRUCIATE LIGAMENT REPAIR Left    2018   BACK SURGERY     COLONOSCOPY  07/20/2011   Procedure: COLONOSCOPY;  Surgeon: Petra Kuba, MD;  Location: [facility] ENDOSCOPY;  Service: Endoscopy;  Laterality: N/A;   ENDOMETRIAL ABLATION  2006   NECK SURGERY     x3    OB History     Gravida  2   Para  2   Term  2   Preterm      AB      Living  2      SAB      IAB      Ectopic      Multiple      Live Births  2            Home Medications    Prior to Admission medications   Medication Sig Start Date End Date Taking? Authorizing Provider  albuterol (VENTOLIN HFA) 108 (90 Base) MCG/ACT inhaler ProAir HFA 90 mcg/actuation aerosol inhaler  [provider]  amLODipine (NORVASC) 10 MG tablet Take 1 tablet (10 mg total) by mouth daily. 11/25/21   Jake Shark, PA-C  busPIRone (BUSPAR) 5 MG tablet Take 1 tablet (5 mg total) by mouth at bedtime as needed. 11/25/21   Jake Shark, PA-C  cetirizine (ZYRTEC ALLERGY) 10 MG tablet Take 1 tablet (10 mg total) by mouth at bedtime. 07/13/21 08/12/21  Theadora Rama Scales, PA-C  ergocalciferol (VITAMIN D2) 1.25 MG (50000 UT) capsule ergocalciferol (vitamin D2) 1,250 mcg (50,000 unit) capsule  TAKE ONE CAPSULE BY MOUTH Once weekly    [provider]  esomeprazole (NEXIUM) 40 MG capsule esomeprazole magnesium 40 mg capsule,delayed release  TAKE ONE CAPSULE BY MOUTH EVERY DAY    [provider]  fluticasone (FLONASE) 50 MCG/ACT nasal spray Place 2 sprays into both nostrils daily. 07/13/21   Theadora Rama Scales, PA-C  Ibuprofen (MOTRIN PO) Take by mouth.    [provider]  ipratropium (ATROVENT) 0.06 % nasal spray Place 2 sprays into both nostrils 4 (four) times daily. As needed for nasal congestion, runny nose 07/13/21   Theadora Rama Scales, PA-C  tiZANidine (ZANAFLEX) 4 MG tablet tizanidine 4 mg tablet  TAKE ONE TABLET BY MOUTH TWICE DAILY    [provider]  montelukast (SINGULAIR) 10 MG tablet Take 10 mg by mouth at bedtime.  02/16/19  [provider]  traZODone (DESYREL) 100 MG tablet at bedtime.  02/16/19  [provider]    Family History Family History  Problem Relation Age of Onset   Colon cancer Other    Anesthesia problems Neg Hx    Hypotension Neg Hx    Malignant hyperthermia Neg Hx    Pseudochol deficiency Neg Hx    Esophageal cancer Neg Hx    Rectal cancer Neg Hx    Stomach cancer Neg Hx     Social History Social History   Tobacco Use   Smoking status: Never   Smokeless tobacco: Never  Vaping Use   Vaping Use: Never used  Substance Use Topics   Alcohol use: Yes    Comment: rarely   Drug use: No     Allergies   Tramadol hcl and Latex   Review of Systems Review of Systems  Gastrointestinal:  Positive for abdominal pain.  All other systems reviewed and are negative.    Physical Exam Triage Vital Signs ED Triage Vitals  Enc Vitals Group     BP 02/03/22 1442 (!) 142/69     Pulse Rate 02/03/22 1442 72     Resp 02/03/22 1442 14     Temp 02/03/22 1442 98.7 F (37.1 C)     Temp Source 02/03/22 1442 Oral     SpO2 02/03/22 1442 100 %     Weight --      Height --      Head Circumference --      Peak Flow --      Pain Score 02/03/22 1440 10     Pain Loc --      Pain Edu? --      Excl. in GC? --    No data found.  Updated Vital Signs BP (!) 142/69 (BP Location: Right Arm)   Pulse 72   Temp 98.7 F (37.1 C) (Oral)   Resp 14   SpO2 100%   Visual Acuity Right Eye Distance:   Left Eye Distance:   Bilateral Distance:    Right Eye Near:   Left Eye  Near:    Bilateral  Near:     Physical Exam Vitals reviewed.  Constitutional:      General: She is not in acute distress.    Appearance: Normal appearance. She is not ill-appearing.  HENT:     Head: Normocephalic and atraumatic.     Mouth/Throat:     Mouth: Mucous membranes are moist.     Comments: Moist mucous membranes Eyes:     Extraocular Movements: Extraocular movements intact.     Pupils: Pupils are equal, round, and reactive to light.  Cardiovascular:     Rate and Rhythm: Normal rate and regular rhythm.     Heart sounds: Normal heart sounds.  Pulmonary:     Effort: Pulmonary effort is normal.     Breath sounds: Normal breath sounds. No wheezing, rhonchi or rales.  Abdominal:     General: Bowel sounds are normal. There is no distension.     Palpations: Abdomen is soft. There is no mass.     Tenderness: There is abdominal tenderness in the right upper quadrant, right lower quadrant and periumbilical area. There is no right CVA tenderness, left CVA tenderness, guarding or rebound. Negative signs include Murphy's sign, Rovsing's sign and McBurney's sign.     Comments: Uncomfortable throughout exam. Exquisitely TTP in the RLQ. No obvious mass or hernia. BS positive throughout.   Skin:    General: Skin is warm.     Capillary Refill: Capillary refill takes less than 2 seconds.     Comments: Good skin turgor  Neurological:     General: No focal deficit present.     Mental Status: She is alert and oriented to person, place, and time.  Psychiatric:        Mood and Affect: Mood normal.        Behavior: Behavior normal.      UC Treatments / Results  Labs (all labs ordered are listed, but only abnormal results are displayed) Labs Reviewed - No data to display  EKG   Radiology No results found.  Procedures Procedures (including critical care time)  Medications Ordered in UC Medications - No data to display  Initial Impression / Assessment and Plan / UC Course  I have reviewed the  triage vital signs and the nursing notes.  Pertinent labs & imaging results that were available during my care of the patient were reviewed by me and considered in my medical decision making (see chart for details).     This patient is a very pleasant 61 y.o. year old female presenting with acute abdomen. Afebrile, nontachy. She is exquisitely TTP in the RLQ, with tenderness in the periumbilical and RUQ as well. Ddx is appendicitis vs cholecystitis vs diverticulitis vs hernia vs other. Sent to ED via POV, she is in agreement.   Final Clinical Impressions(s) / UC Diagnoses   Final diagnoses:  Abdominal pain, RLQ     Discharge Instructions      -I am concerned that your severe abdominal pain could indicate an acute abdominal process, like appendicitis or gallbladder disease.  You could also have infection in the intestines like diverticulitis, or even an abscess or hernia.  Any of these conditions are an emergency, so you should head to the emergency department for imaging and lab work that I cannot do at urgent care.     ED Prescriptions   None    PDMP not reviewed this encounter.   Rhys Martini, PA-C 02/03/22 1536

## 2022-02-05 NOTE — Progress Notes (Signed)
Established Patient Office Visit  Subjective:  Patient ID: Teresa Mcmahon, female    DOB: 1960-09-08  Age: 61 y.o. MRN: [id number]  CC:  Chief Complaint  Patient presents with   Follow-up    ER follow up on stomach pain. She is still in pain and having diarrhea and the Er told her she needs a GI referral.    HPI Teresa Mcmahon presents for follow up of her RLQ abdominal pain and colitis that was evaluated on 02/03/2022 in the ED. Reports she is taking her medicine as prescribed and her pain level has decreased; reports she doesn't have much of an appetite and now has diarrhea that Pepto Bismol is not helping.  Outpatient Medications Prior to Visit  Medication Sig Dispense Refill   albuterol (VENTOLIN HFA) 108 (90 Base) MCG/ACT inhaler ProAir HFA 90 mcg/actuation aerosol inhaler     amLODipine (NORVASC) 10 MG tablet Take 1 tablet (10 mg total) by mouth daily. 90 tablet 0   busPIRone (BUSPAR) 5 MG tablet Take 1 tablet (5 mg total) by mouth at bedtime as needed. 90 tablet 0   ciprofloxacin (CIPRO) 500 MG tablet Take 1 tablet (500 mg total) by mouth every 12 (twelve) hours. 10 tablet 0   ergocalciferol (VITAMIN D2) 1.25 MG (50000 UT) capsule ergocalciferol (vitamin D2) 1,250 mcg (50,000 unit) capsule  TAKE ONE CAPSULE BY MOUTH Once weekly     esomeprazole (NEXIUM) 40 MG capsule esomeprazole magnesium 40 mg capsule,delayed release  TAKE ONE CAPSULE BY MOUTH EVERY DAY     fluticasone (FLONASE) 50 MCG/ACT nasal spray Place 2 sprays into both nostrils daily. 18 mL 0   Ibuprofen (MOTRIN PO) Take by mouth.     ipratropium (ATROVENT) 0.06 % nasal spray Place 2 sprays into both nostrils 4 (four) times daily. As needed for nasal congestion, runny nose 15 mL 0   metroNIDAZOLE (FLAGYL) 500 MG tablet Take 1 tablet (500 mg total) by mouth 2 (two) times daily. 14 tablet 0   ondansetron (ZOFRAN-ODT) 4 MG disintegrating tablet Take 1 tablet (4 mg total) by mouth every 8 (eight) hours as  needed for nausea or vomiting. 20 tablet 0   oxyCODONE (ROXICODONE) 5 MG immediate release tablet Take 1 tablet (5 mg total) by mouth every 4 (four) hours as needed for severe pain. 15 tablet 0   tiZANidine (ZANAFLEX) 4 MG tablet tizanidine 4 mg tablet  TAKE ONE TABLET BY MOUTH TWICE DAILY     cetirizine (ZYRTEC ALLERGY) 10 MG tablet Take 1 tablet (10 mg total) by mouth at bedtime. 30 tablet 0   No facility-administered medications prior to visit.    Allergies  Allergen Reactions   Tramadol Hcl Itching and Nausea Only   Latex Itching and Rash    Patient Care Team: Marcellina Millin as PCP - General (Physician Assistant) Shelly Bombard, MD as Consulting Physician (Obstetrics and Gynecology)  ROS Review of Systems  Constitutional:  Positive for appetite change. Negative for activity change and chills.  HENT:  Negative for congestion and voice change.   Eyes:  Negative for pain and redness.  Respiratory:  Negative for cough and wheezing.   Cardiovascular:  Negative for chest pain.  Gastrointestinal:  Positive for abdominal pain and diarrhea. Negative for blood in stool, constipation, nausea and vomiting.  Endocrine: Negative for polyuria.  Genitourinary:  Negative for frequency.  Skin:  Negative for color change and rash.  Allergic/Immunologic: Negative for immunocompromised state.  Neurological:  Negative for  dizziness.  Psychiatric/Behavioral:  Negative for agitation.       Objective:    Physical Exam Vitals and nursing note reviewed.  Constitutional:      General: She is not in acute distress.    Appearance: She is normal weight. She is not ill-appearing.  HENT:     Head: Normocephalic and atraumatic.     Right Ear: External ear normal.     Left Ear: External ear normal.  Eyes:     Extraocular Movements: Extraocular movements intact.     Conjunctiva/sclera: Conjunctivae normal.     Pupils: Pupils are equal, round, and reactive to light.  Cardiovascular:      Rate and Rhythm: Normal rate and regular rhythm.     Pulses: Normal pulses.     Heart sounds: Normal heart sounds.  Pulmonary:     Effort: Pulmonary effort is normal.     Breath sounds: Normal breath sounds. No wheezing.  Abdominal:     General: Bowel sounds are normal.     Palpations: Abdomen is soft.  Musculoskeletal:        General: Normal range of motion.     Cervical back: Normal range of motion.  Skin:    General: Skin is warm and dry.  Neurological:     Mental Status: She is alert and oriented to person, place, and time.  Psychiatric:        Mood and Affect: Mood normal.     BP 130/70   Pulse 71   Ht '5\' 6"'$  (1.676 m)   Wt 185 lb (83.9 kg)   SpO2 98%   BMI 29.86 kg/m   Wt Readings from Last 3 Encounters:  02/06/22 185 lb (83.9 kg)  02/03/22 183 lb (83 kg)  11/25/21 183 lb 12.8 oz (83.4 kg)    Results for orders placed or performed during the hospital encounter of 02/03/22  Lipase, blood  Result Value Ref Range   Lipase 32 11 - 51 U/L  Comprehensive metabolic panel  Result Value Ref Range   Sodium 137 135 - 145 mmol/L   Potassium 4.1 3.5 - 5.1 mmol/L   Chloride 106 98 - 111 mmol/L   CO2 23 22 - 32 mmol/L   Glucose, Bld 99 70 - 99 mg/dL   BUN 13 8 - 23 mg/dL   Creatinine, Ser 0.97 0.44 - 1.00 mg/dL   Calcium 9.8 8.9 - 10.3 mg/dL   Total Protein 7.8 6.5 - 8.1 g/dL   Albumin 4.1 3.5 - 5.0 g/dL   AST 30 15 - 41 U/L   ALT 24 0 - 44 U/L   Alkaline Phosphatase 70 38 - 126 U/L   Total Bilirubin 0.4 0.3 - 1.2 mg/dL   GFR, Estimated >60 >60 mL/min   Anion gap 8 5 - 15  CBC  Result Value Ref Range   WBC 8.3 4.0 - 10.5 K/uL   RBC 4.74 3.87 - 5.11 MIL/uL   Hemoglobin 10.8 (L) 12.0 - 15.0 g/dL   HCT 34.7 (L) 36.0 - 46.0 %   MCV 73.2 (L) 80.0 - 100.0 fL   MCH 22.8 (L) 26.0 - 34.0 pg   MCHC 31.1 30.0 - 36.0 g/dL   RDW 15.2 11.5 - 15.5 %   Platelets 253 150 - 400 K/uL   nRBC 0.0 0.0 - 0.2 %  Urinalysis, Routine w reflex microscopic Urine, Clean Catch  Result  Value Ref Range   Color, Urine YELLOW YELLOW   APPearance CLEAR CLEAR  Specific Gravity, Urine 1.027 1.005 - 1.030   pH 6.0 5.0 - 8.0   Glucose, UA NEGATIVE NEGATIVE mg/dL   Hgb urine dipstick NEGATIVE NEGATIVE   Bilirubin Urine NEGATIVE NEGATIVE   Ketones, ur NEGATIVE NEGATIVE mg/dL   Protein, ur NEGATIVE NEGATIVE mg/dL   Nitrite NEGATIVE NEGATIVE   Leukocytes,Ua NEGATIVE NEGATIVE    CLINICAL DATA:  Right lower quadrant abdominal pain.   EXAM: CT ABDOMEN AND PELVIS WITH CONTRAST   TECHNIQUE: Multidetector CT imaging of the abdomen and pelvis was performed using the standard protocol following bolus administration of intravenous contrast.   RADIATION DOSE REDUCTION: This exam was performed according to the departmental dose-optimization program which includes automated exposure control, adjustment of the mA and/or kV according to patient size and/or use of iterative reconstruction technique.   CONTRAST:  19m OMNIPAQUE IOHEXOL 300 MG/ML  SOLN   COMPARISON:  Pelvic ultrasound 09/23/2020   FINDINGS: Lower chest: No focal airspace disease or pleural effusion. The heart is normal in size.   Hepatobiliary: No focal liver abnormality. Elongated left lobe of the liver, normal variant. Overall normal liver size and density. Gallbladder is only minimally distended. No calcified gallstone. No pericholecystic fat stranding. No biliary dilatation.   Pancreas: No ductal dilatation or inflammation.   Spleen: Normal in size without focal abnormality.   Adrenals/Urinary Tract: Normal adrenal glands. No hydronephrosis or perinephric edema. Homogeneous renal enhancement with symmetric excretion on delayed phase imaging. No renal calculi or focal renal lesion. Urinary bladder is physiologically distended without wall thickening.   Stomach/Bowel: Normal air-filled appendix courses into the right upper quadrant under the right hepatic lobe. There is mild wall thickening and  pericolonic edema involving the cecum and moderate length ascending colon. No pneumatosis. No other sites of bowel wall thickening or inflammatory change. Small bowel approaches a wide necked umbilical hernia but does not extend into the hernia sac. Small to moderate stool burden the more distal colon. No significant diverticular disease ingested material distends the stomach without gastric inflammation.   Vascular/Lymphatic: Normal caliber abdominal aorta. Patent portal vein no acute vascular findings. Scattered lymph nodes in the abdomen and pelvis are not enlarged by size criteria, no suspicious adenopathy.   Reproductive: Small uterine fibroid on prior pelvic ultrasound is not well seen on the current exam. There is no adnexal mass.   Other: No free air, free fluid, or intra-abdominal fluid collection. Small to moderate size minimally complex fat containing umbilical hernia. Small bowel loops approach the hernia neck but do not extend into the hernia sac.   Musculoskeletal: Lower lumbar degenerative disc disease and facet hypertrophy. 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 No focal bone lesion.   IMPRESSION: 1. Mild wall thickening and pericolonic edema involving the cecum and moderate length ascending colon, suspicious for colitis. This may be infectious or inflammatory. Recommend up-to-date colonoscopy to exclude the remote possibility of underlying colonic neoplasm. 2. Normal appendix. 3. Small to moderate size minimally complex fat containing umbilical hernia. Small bowel loops approach the hernia neck but do not extend into the hernia sac. 4. 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     Electronically Signed   By: MKeith RakeM.D.   On: 02/03/2022 20:52    The ASCVD Risk score (Arnett DK, et al., 2019) failed to calculate for the following reasons:   Cannot find a previous HDL lab   Cannot find a previous total  cholesterol lab    Assessment & Plan:  Problem List Items Addressed This Visit   None Visit Diagnoses     Abdominal pain, right lower quadrant    -  Primary   Relevant Orders   Ambulatory referral to Gastroenterology   Colitis       Relevant Orders   Ambulatory referral to Gastroenterology   Diarrhea, unspecified type       Relevant Orders   Ambulatory referral to Gastroenterology  Finish medications as directed that were prescribed in the ED.Will also add lomotil for diarrhea  Ciprofloxacin HCl 500 mg Oral Every 12 hours  metroNIDAZOLE 500 mg Oral 2 times daily  Ondansetron 4 mg Oral Every 8 hours PRN  oxyCODONE HCl 5 mg Oral Every 4 hours PRN    I have logged into and reviewed this patient's [facility] today prior to issuing prescriptions for any controlled substances.  Meds ordered this encounter  Medications   diphenoxylate-atropine (LOMOTIL) 2.5-0.025 MG tablet    Sig: Take 1 tablet by mouth 4 (four) times daily as needed for diarrhea or loose stools.    Dispense:  15 tablet    Refill:  0    Order Specific Question:   Supervising Provider    Answer:   Denita Lung [[id number]]   If symptoms get worse, please call the office for a follow up appointment if available, otherwise go to Urgent Care, call 911 / EMS, or go to the Emergency Department for further evaluation.   Follow-up: Return for Return as Already Scheduled.    Irene Pap, PA-C

## 2022-02-06 ENCOUNTER — Ambulatory Visit (INDEPENDENT_AMBULATORY_CARE_PROVIDER_SITE_OTHER): Payer: Commercial Managed Care - HMO | Admitting: Physician Assistant

## 2022-02-06 ENCOUNTER — Encounter: Payer: Self-pay | Admitting: Physician Assistant

## 2022-02-06 VITALS — BP 130/70 | HR 71 | Ht 66.0 in | Wt 185.0 lb

## 2022-02-06 DIAGNOSIS — K529 Noninfective gastroenteritis and colitis, unspecified: Secondary | ICD-10-CM | POA: Diagnosis not present

## 2022-02-06 DIAGNOSIS — R1031 Right lower quadrant pain: Secondary | ICD-10-CM | POA: Diagnosis not present

## 2022-02-06 DIAGNOSIS — R197 Diarrhea, unspecified: Secondary | ICD-10-CM

## 2022-02-06 MED ORDER — DIPHENOXYLATE-ATROPINE 2.5-0.025 MG PO TABS: 1.0000 | ORAL_TABLET | Freq: Four times a day (QID) | ORAL | 0 refills | Status: DC | PRN

## 2022-02-06 NOTE — Patient Instructions (Signed)
You will get a call to schedule an appointment with Gastroenterology

## 2022-02-09 ENCOUNTER — Encounter: Payer: Self-pay | Admitting: Physician Assistant

## 2022-02-16 ENCOUNTER — Ambulatory Visit: Payer: Commercial Managed Care - HMO | Admitting: Obstetrics & Gynecology

## 2022-03-06 ENCOUNTER — Encounter: Payer: Self-pay | Admitting: Physician Assistant

## 2022-03-06 ENCOUNTER — Ambulatory Visit: Payer: Commercial Managed Care - HMO | Admitting: Physician Assistant

## 2022-03-06 ENCOUNTER — Other Ambulatory Visit (INDEPENDENT_AMBULATORY_CARE_PROVIDER_SITE_OTHER): Payer: Commercial Managed Care - HMO

## 2022-03-06 VITALS — BP 122/74 | HR 70 | Ht 66.0 in | Wt 182.0 lb

## 2022-03-06 DIAGNOSIS — R1031 Right lower quadrant pain: Secondary | ICD-10-CM

## 2022-03-06 DIAGNOSIS — Z86010 Personal history of colonic polyps: Secondary | ICD-10-CM

## 2022-03-06 DIAGNOSIS — R933 Abnormal findings on diagnostic imaging of other parts of digestive tract: Secondary | ICD-10-CM

## 2022-03-06 LAB — CBC WITH DIFFERENTIAL/PLATELET
Basophils Absolute: 0 10*3/uL (ref 0.0–0.1)
Basophils Relative: 0.6 % (ref 0.0–3.0)
Eosinophils Absolute: 0.1 10*3/uL (ref 0.0–0.7)
Eosinophils Relative: 1.9 % (ref 0.0–5.0)
HCT: 33.6 % — ABNORMAL LOW (ref 36.0–46.0)
Hemoglobin: 10.4 g/dL — ABNORMAL LOW (ref 12.0–15.0)
Lymphocytes Relative: 37.3 % (ref 12.0–46.0)
Lymphs Abs: 1.6 10*3/uL (ref 0.7–4.0)
MCHC: 31 g/dL (ref 30.0–36.0)
MCV: 71.7 fl — ABNORMAL LOW (ref 78.0–100.0)
Monocytes Absolute: 0.4 10*3/uL (ref 0.1–1.0)
Monocytes Relative: 9.6 % (ref 3.0–12.0)
Neutro Abs: 2.2 10*3/uL (ref 1.4–7.7)
Neutrophils Relative %: 50.6 % (ref 43.0–77.0)
Platelets: 203 10*3/uL (ref 150.0–400.0)
RBC: 4.69 Mil/uL (ref 3.87–5.11)
RDW: 15.6 % — ABNORMAL HIGH (ref 11.5–15.5)
WBC: 4.4 10*3/uL (ref 4.0–10.5)

## 2022-03-06 LAB — B12 AND FOLATE PANEL
Folate: >24.2 ng/mL (ref >5.9)
Vitamin B-12: 937 pg/mL — ABNORMAL HIGH (ref 211–911)

## 2022-03-06 LAB — IBC + FERRITIN
Ferritin: 178.6 ng/mL (ref 10.0–291.0)
Iron: 77 ug/dL (ref 42–145)
Saturation Ratios: 21 % (ref 20.0–50.0)
TIBC: 366.8 ug/dL (ref 250.0–450.0)
Transferrin: 262 mg/dL (ref 212.0–360.0)

## 2022-03-06 LAB — C-REACTIVE PROTEIN: CRP: <1 mg/dL (ref 0.5–20.0)

## 2022-03-06 LAB — SEDIMENTATION RATE: Sed Rate: 60 mm/hr — ABNORMAL HIGH (ref 0–30)

## 2022-03-06 MED ORDER — NA SULFATE-K SULFATE-MG SULF 17.5-3.13-1.6 GM/177ML PO SOLN: 1.0000 | Freq: Once | ORAL | 0 refills | Status: AC

## 2022-03-06 MED ORDER — DICYCLOMINE HCL 10 MG PO CAPS: 10.0000 mg | ORAL_CAPSULE | Freq: Four times a day (QID) | ORAL | 1 refills | Status: DC | PRN

## 2022-03-06 NOTE — Progress Notes (Addendum)
Subjective:    Patient ID: Teresa Mcmahon, female    DOB: [birth year], 61 y.o.   MRN: [id number]  HPI Teresa Mcmahon is a pleasant 61 year old African-American female, established with Dr. Loletha Carrow.  She had been seen here previously for colonoscopy in April 2019 at which time she was found to have a 6 mm polyp in the ascending colon which was a tubular adenoma and otherwise negative exam.  She was indicated for 5-year interval follow-up. She comes in today after recent ER visit about 1 month ago on 02/03/2022 when she presented with 2 to 3-week history of right lower quadrant pain which became progressively more intense, and started radiating up into the right middle and right upper abdomen and was associated with significant abdominal cramping.  She did not not have any associated diarrhea and was having regular bowel movements, no melena or hematochezia.  No nausea or vomiting, no fever or chills.  She had not been on any new medications or antibiotics prior to onset of symptoms. Evaluation in the ER with labs showing WBC of 8.3 hemoglobin 10.8/hematocrit 34.7/MCV of 73 c-Met unremarkable and lipase within normal limits. CT of the abdomen and pelvis was done with contrast that showed mild wall thickening and pericolonic edema at the cecum and including a moderate length of the ascending colon, no pneumatosis, no other sites of bowel wall thickening or inflammatory changes, small bowel approach is a wide necked umbilical hernia does not extend into the hernia sac.  Normal caliber abdominal aortic, patent portal vein and no acute vascular findings,, avascular necrosis of the right femoral head without collapse.  She was started on a course of Cipro and Flagyl which she says she completed.  She did feel better within 2 to 3 days of starting the antibiotics, and symptoms significantly improved.  Now over the past 2 weeks she has had some mild residual discomfort and mild worsening again primarily of pain in  the right lower quadrant.  She still is having normal bowel movements, no diarrhea, no rectal bleeding or melena no fever.  Other medical issues include osteoarthritis and hypertension and GERD for which she is on Nexium..  Review of Systems Pertinent positive and negative review of systems were noted in the above HPI section.  All other review of systems was otherwise negative.   Outpatient Encounter Medications as of 03/06/2022  Medication Sig   albuterol (VENTOLIN HFA) 108 (90 Base) MCG/ACT inhaler ProAir HFA 90 mcg/actuation aerosol inhaler   amLODipine (NORVASC) 10 MG tablet Take 1 tablet (10 mg total) by mouth daily.   busPIRone (BUSPAR) 5 MG tablet Take 1 tablet (5 mg total) by mouth at bedtime as needed.   dicyclomine (BENTYL) 10 MG capsule Take 1 capsule (10 mg total) by mouth every 6 (six) hours as needed for spasms.   diphenoxylate-atropine (LOMOTIL) 2.5-0.025 MG tablet Take 1 tablet by mouth 4 (four) times daily as needed for diarrhea or loose stools.   ergocalciferol (VITAMIN D2) 1.25 MG (50000 UT) capsule ergocalciferol (vitamin D2) 1,250 mcg (50,000 unit) capsule  TAKE ONE CAPSULE BY MOUTH Once weekly   esomeprazole (NEXIUM) 40 MG capsule esomeprazole magnesium 40 mg capsule,delayed release  TAKE ONE CAPSULE BY MOUTH EVERY DAY   fluticasone (FLONASE) 50 MCG/ACT nasal spray Place 2 sprays into both nostrils daily.   Ibuprofen (MOTRIN PO) Take by mouth.   ipratropium (ATROVENT) 0.06 % nasal spray Place 2 sprays into both nostrils 4 (four) times daily. As needed for nasal congestion, runny  nose   Na Sulfate-K Sulfate-Mg Sulf 17.5-3.13-1.6 GM/177ML SOLN Take 1 kit by mouth once for 1 dose.   oxyCODONE (ROXICODONE) 5 MG immediate release tablet Take 1 tablet (5 mg total) by mouth every 4 (four) hours as needed for severe pain.   tiZANidine (ZANAFLEX) 4 MG tablet tizanidine 4 mg tablet  TAKE ONE TABLET BY MOUTH TWICE DAILY   cetirizine (ZYRTEC ALLERGY) 10 MG tablet Take 1 tablet (10  mg total) by mouth at bedtime.   ciprofloxacin (CIPRO) 500 MG tablet Take 1 tablet (500 mg total) by mouth every 12 (twelve) hours. (Patient not taking: Reported on 03/06/2022)   metroNIDAZOLE (FLAGYL) 500 MG tablet Take 1 tablet (500 mg total) by mouth 2 (two) times daily. (Patient not taking: Reported on 03/06/2022)   ondansetron (ZOFRAN-ODT) 4 MG disintegrating tablet Take 1 tablet (4 mg total) by mouth every 8 (eight) hours as needed for nausea or vomiting. (Patient not taking: Reported on 03/06/2022)   [DISCONTINUED] montelukast (SINGULAIR) 10 MG tablet Take 10 mg by mouth at bedtime.   [DISCONTINUED] traZODone (DESYREL) 100 MG tablet at bedtime.   No facility-administered encounter medications on file as of 03/06/2022.   Allergies  Allergen Reactions   Tramadol Hcl Itching and Nausea Only   Latex Itching and Rash   Patient Active Problem List   Diagnosis Date Noted   GERD without esophagitis 11/25/2021   Primary hypertension 11/25/2021   Carpal tunnel syndrome of left wrist 10/29/2020   Carpal tunnel syndrome of right wrist 10/29/2020   Trigger thumb of left hand 12/23/2017   Osteoarthritis of carpometacarpal (CMC) joint of thumb 10/08/2017   Arthritis of carpometacarpal (CMC) joint of right thumb 10/08/2017   Status post arthroscopy of left knee 06/15/2016   Cervical spondylosis with myelopathy 06/05/2015   TENOSYNOVITIS, WRIST 06/03/2010   WRIST PAIN, LEFT 05/02/2010   ANEMIA, DEFICIENCY, HX OF 11/11/2009   UNSPECIFIED GLAUCOMA 10/24/2008   Unspecified glaucoma 10/24/2008   ASTHMA 08/27/2008   ANXIETY DEPRESSION 05/11/2007   HEADACHE, TENSION 05/11/2007   Social History   Socioeconomic History   Marital status: Married    Spouse name: Not on file   Number of children: 2   Years of education: Not on file   Highest education level: Not on file  Occupational History   Occupation: Secretary/administrator  Tobacco Use   Smoking status: Never   Smokeless tobacco: Never  Vaping Use    Vaping Use: Never used  Substance and Sexual Activity   Alcohol use: Yes    Comment: once a month a beer are liqour   Drug use: No   Sexual activity: Yes    Birth control/protection: Post-menopausal  Other Topics Concern   Not on file  Social History Narrative   Not on file   Social Determinants of Health   Financial Resource Strain: Not on file  Food Insecurity: Not on file  Transportation Needs: Not on file  Physical Activity: Not on file  Stress: Not on file  Social Connections: Not on file  Intimate Partner Violence: Not on file    Teresa Mcmahon's family history includes ALS in her mother; Chronic Renal Failure in her father; Colon cancer in an other family member; Diabetes in her sister and sister; Hypertension in her father, mother, sister, and sister; Lupus in her sister, sister, sister, and sister.      Objective:    Vitals:   03/06/22 1028  BP: 122/74  Pulse: 70    Physical Exam Well-developed  well-nourished  AA female  in no acute distress.  Height, Weight,182 BMI 29.3  HEENT; nontraumatic normocephalic, EOMI, PE R LA, sclera anicteric. Oropharynx;not examined Neck; supple, no JVD Cardiovascular; regular rate and rhythm with S1-S2, no murmur rub or gallop Pulmonary; Clear bilaterally Abdomen; soft, very small umbilical hernia, there is tenderness in the right lower quadrant, no guarding or rebound, nondistended, no palpable mass or hepatosplenomegaly, bowel sounds are active Rectal; not done today Skin; benign exam, no jaundice rash or appreciable lesions Extremities; no clubbing cyanosis or edema skin warm and dry Neuro/Psych; alert and oriented x4, grossly nonfocal mood and affect appropriate        Assessment & Plan:   #2 61 year old African-American female, diagnosed with a right-sided colitis at the time of ER visit 02/03/2022 after having somewhat progressive right lower quadrant abdominal pain eventually involving the entire right abdomen over about 2  to 3 weeks prior to that ER visit.  Interestingly no associated diarrhea, no melena or hematochezia.  No fever or chills, she did have significant abdominal cramping.  She was treated with a course of Cipro and Flagyl with improvement in symptoms, however now 2 weeks off of antibiotics she still has right lower quadrant pain/discomfort with some mild progression over the past week.  I am not convinced that this was an infectious colitis, and now symptoms are persisting off antibiotics though not as bad as with initial presentation. No comments on abnormal mesenteric vasculature on contrasted CT  Rule out new onset IBD, versus other inflammatory process.  #2 history of adenomatous colon polyps-last colonoscopy April 2019 and was indicated for 5-year interval follow-up #3 hypertension #4.  GERD stable on Nexium #5.  Osteoarthritis #6 chronic microcytic anemia-patient states present her entire life question thalassemia will reassess for iron deficiency or B12 deficiency  Plan; CBC with differential, sed rate, CRP iron studies B12 and follow Start trial of Bentyl 10 mg every 6 hours as needed for abdominal pain/cramping. Patient will be scheduled for colonoscopy with Dr. Loletha Carrow . Procedure was discussed in detail with the patient including indications risk and benefits and she is agreeable to proceed. She knows to call should she have any worsening of symptoms in the interim.    Teresa Saini S Yasin Ducat PA-C 03/06/2022   Cc: Irene Pap, PA-C

## 2022-03-06 NOTE — Patient Instructions (Addendum)
If you are age 63 or younger, your body mass index should be between 19-25. Your Body mass index is 29.38 kg/m. If this is out of the aformentioned range listed, please consider follow up with your Primary Care Provider.  ________________________________________________________  The [facility] providers would like to encourage you to use [facility] to communicate with providers for non-urgent requests or questions.  Due to long hold times on the telephone, sending your provider a message by [facility] may be a faster and more efficient way to get a response.  Please allow 48 business hours for a response.  Please remember that this is for non-urgent requests.  _______________________________________________________  Teresa Mcmahon have been scheduled for a colonoscopy. Please follow written instructions given to you at your visit today.  Please pick up your prep supplies at the pharmacy within the next 1-3 days. If you use inhalers (even only as needed), please bring them with you on the day of your procedure.  Your provider has requested that you go to the basement level for lab work before leaving today. Press "B" on the elevator. The lab is located at the first door on the left as you exit the elevator.  START Dicyclomine 10 mg 1 capsule every 6 hours as needed  Use Tylenol as needed for regular pain.  Follow up pending the results of your labs or as needed.  Thank you for entrusting me with your care and choosing [facility].  Amy Esterwood, PA-c

## 2022-03-09 NOTE — Progress Notes (Signed)
____________________________________________________________  Attending physician addendum:  Thank you for sending this case to me. I have reviewed the entire note and agree with the plan.  I reviewed the recent CTAP images, and there is significant right colon wall thickening without adjacent stranding or evidence of proximal obstruction.  Does not appear neoplastic.  Agree no vascular description to suggest risk for ischemic colitis, and this would also be an unusual location for that.  Curious there was no bleeding or diarrhea if this was true colitis, such as what would be expected in an acute infection. Further evaluation with upcoming colonoscopy.  Wilfrid Lund, MD  ____________________________________________________________

## 2022-03-10 HISTORY — PX: COLONOSCOPY: SHX174

## 2022-03-20 ENCOUNTER — Encounter: Payer: Self-pay | Admitting: Gastroenterology

## 2022-03-20 ENCOUNTER — Encounter: Payer: Commercial Managed Care - HMO | Admitting: Internal Medicine

## 2022-03-20 ENCOUNTER — Ambulatory Visit (AMBULATORY_SURGERY_CENTER): Payer: Commercial Managed Care - HMO | Admitting: Gastroenterology

## 2022-03-20 VITALS — BP 141/76 | HR 63 | Temp 98.4°F | Resp 15 | Ht 66.0 in | Wt 182.0 lb

## 2022-03-20 DIAGNOSIS — K6389 Other specified diseases of intestine: Secondary | ICD-10-CM | POA: Diagnosis not present

## 2022-03-20 DIAGNOSIS — R933 Abnormal findings on diagnostic imaging of other parts of digestive tract: Secondary | ICD-10-CM | POA: Diagnosis not present

## 2022-03-20 DIAGNOSIS — D122 Benign neoplasm of ascending colon: Secondary | ICD-10-CM

## 2022-03-20 DIAGNOSIS — R1031 Right lower quadrant pain: Secondary | ICD-10-CM | POA: Diagnosis present

## 2022-03-20 MED ORDER — SODIUM CHLORIDE 0.9 % IV SOLN: 500.0000 mL | INTRAVENOUS | Status: DC

## 2022-03-20 NOTE — Patient Instructions (Signed)
Handout on polyps given   YOU HAD AN ENDOSCOPIC PROCEDURE TODAY AT [facility]:   Refer to the procedure report that was given to you for any specific questions about what was found during the examination.  If the procedure report does not answer your questions, please call your gastroenterologist to clarify.  If you requested that your care partner not be given the details of your procedure findings, then the procedure report has been included in a sealed envelope for you to review at your convenience later.  YOU SHOULD EXPECT: Some feelings of bloating in the abdomen. Passage of more gas than usual.  Walking can help get rid of the air that was put into your GI tract during the procedure and reduce the bloating. If you had a lower endoscopy (such as a colonoscopy or flexible sigmoidoscopy) you may notice spotting of blood in your stool or on the toilet paper. If you underwent a bowel prep for your procedure, you may not have a normal bowel movement for a few days.  Please Note:  You might notice some irritation and congestion in your nose or some drainage.  This is from the oxygen used during your procedure.  There is no need for concern and it should clear up in a day or so.  SYMPTOMS TO REPORT IMMEDIATELY:  Following lower endoscopy (colonoscopy or flexible sigmoidoscopy):  Excessive amounts of blood in the stool  Significant tenderness or worsening of abdominal pains  Swelling of the abdomen that is new, acute  Fever of 100F or higher   For urgent or emergent issues, a gastroenterologist can be reached at any hour by calling [phone]. Do not use MyChart messaging for urgent concerns.    DIET:  We do recommend a small meal at first, but then you may proceed to your regular diet.  Drink plenty of fluids but you should avoid alcoholic beverages for 24 hours.  ACTIVITY:  You should plan to take it easy for the rest of today and you should NOT DRIVE or use heavy  machinery until tomorrow (because of the sedation medicines used during the test).    FOLLOW UP: Our staff will call the number listed on your records the next business day following your procedure.  We will call around 7:15- 8:00 am to check on you and address any questions or concerns that you may have regarding the information given to you following your procedure. If we do not reach you, we will leave a message.  If you develop any symptoms (ie: fever, flu-like symptoms, shortness of breath, cough etc.) before then, please call [phone].  If you test positive for Covid 19 in the 2 weeks post procedure, please call and report this information to [place].    If any biopsies were taken you will be contacted by phone or by letter within the next 1-3 weeks.  Please call us at [phone] if you have not heard about the biopsies in 3 weeks.    SIGNATURES/CONFIDENTIALITY: You and/or your care partner have signed paperwork which will be entered into your electronic medical record.  These signatures attest to the fact that that the information above on your After Visit Summary has been reviewed and is understood.  Full responsibility of the confidentiality of this discharge information lies with you and/or your care-partner.

## 2022-03-20 NOTE — Op Note (Signed)
[facility] Patient Name: Teresa Mcmahon Procedure Date: 03/20/2022 2:07 PM MRN: [id number] Endoscopist: Mallie Mussel L. Danis , MD Age: 61 Referring MD:  Date of Birth: 07/01/1961 Gender: Female Account #: [id number] Procedure:                Colonoscopy Indications:              Abdominal pain in the right lower quadrant,                            Abnormal CT of the GI tract                           Acute onset RLQ pain without diarrhea or                            hematochezia -> ED visit with CT reporting [facility]                            colon wall thickening                           Pain slowly subsided since then Medicines:                Monitored Anesthesia Care Procedure:                Pre-Anesthesia Assessment:                           - Prior to the procedure, a History and Physical                            was performed, and patient medications and                            allergies were reviewed. The patient's tolerance of                            previous anesthesia was also reviewed. The risks                            and benefits of the procedure and the sedation                            options and risks were discussed with the patient.                            All questions were answered, and informed consent                            was obtained. Prior Anticoagulants: The patient has                            taken no previous anticoagulant or antiplatelet  agents. ASA Grade Assessment: II - A patient with                            mild systemic disease. After reviewing the risks                            and benefits, the patient was deemed in                            satisfactory condition to undergo the procedure.                           After obtaining informed consent, the colonoscope                            was passed under direct vision. Throughout the                            procedure, the  patient's blood pressure, pulse, and                            oxygen saturations were monitored continuously. The                            CF [id number] #[id number] was introduced through the anus                            and advanced to the the terminal ileum, with                            identification of the appendiceal orifice and IC                            valve. The colonoscopy was performed without                            difficulty. The patient tolerated the procedure                            well. The quality of the bowel preparation was                            good. The terminal ileum, ileocecal valve,                            appendiceal orifice, and rectum were photographed. Scope In: 2:13:07 PM Scope Out: 2:40:49 PM Scope Withdrawal Time: 0 hours 23 minutes 53 seconds  Total Procedure Duration: 0 hours 27 minutes 42 seconds  Findings:                 The perianal and digital rectal examinations were                            normal.  The terminal ileum appeared normal.                           Four semi-sessile polyps were found in the                            ascending colon. The polyps were diminutive in                            size. These polyps were removed with a cold snare.                            Resection and retrieval were complete.                           An 18 x 4 mm polyp was found in the proximal                            ascending colon. The polyp was sessile (between                            folds). The polyp was removed with a piecemeal                            technique using a cold snare. Resection and                            retrieval were complete.                           Repeat examination of right colon under NBI                            performed.                           There was a medium-sized lipoma, in the proximal                            descending colon.                            The exam was otherwise without abnormality on                            direct and retroflexion views. Complications:            No immediate complications. Estimated Blood Loss:     Estimated blood loss was minimal. Impression:               - The examined portion of the ileum was normal.                           - Four diminutive polyps in the ascending colon,  removed with a cold snare. Resected and retrieved.                           - One 18 x 4 mm polyp in the proximal ascending                            colon, removed piecemeal using a cold snare.                            Resected and retrieved.                           - Medium-sized lipoma in the proximal descending                            colon.                           - The examination was otherwise normal on direct                            and retroflexion views.                           No colitis seen. ? acute infectious issue now                            resolved? Recommendation:           - Patient has a contact number available for                            emergencies. The signs and symptoms of potential                            delayed complications were discussed with the                            patient. Return to normal activities tomorrow.                            Written discharge instructions were provided to the                            patient.                           - Resume previous diet.                           - Continue present medications.                           - Await pathology results.                           - Repeat colonoscopy is recommended for  surveillance. The colonoscopy date will be                            determined after pathology results from today's                            exam become available for review. Sukaina Toothaker L. Loletha Carrow, MD 03/20/2022 2:50:00 PM This report has been signed electronically.

## 2022-03-20 NOTE — Progress Notes (Signed)
Called to room to assist during endoscopic procedure.  Patient ID and intended procedure confirmed with present staff. Received instructions for my participation in the procedure from the performing physician.  

## 2022-03-20 NOTE — Progress Notes (Signed)
Report given to PACU, vss 

## 2022-03-20 NOTE — Progress Notes (Signed)
Clinical details in note from [facility] office visit on 03/06/22. Today she reports that the pain is slowly subsiding. The patient is appropriate for an endoscopic procedure in the ambulatory setting.  - Wilfrid Lund, MD

## 2022-03-23 ENCOUNTER — Telehealth: Payer: Self-pay

## 2022-03-23 NOTE — Telephone Encounter (Signed)
Follow up call to pt, lm for pt to call if having any difficulty with normal activities or eating and drinking.  Also to call if any other questions or concerns.  

## 2022-03-26 ENCOUNTER — Encounter: Payer: Self-pay | Admitting: Gastroenterology

## 2022-04-15 ENCOUNTER — Encounter: Payer: Self-pay | Admitting: Internal Medicine

## 2022-04-16 ENCOUNTER — Other Ambulatory Visit: Payer: Self-pay | Admitting: Physician Assistant

## 2022-05-19 ENCOUNTER — Encounter: Payer: Self-pay | Admitting: Internal Medicine

## 2022-05-22 ENCOUNTER — Encounter: Payer: Managed Care, Other (non HMO) | Admitting: Physician Assistant

## 2022-06-05 ENCOUNTER — Ambulatory Visit: Payer: Commercial Managed Care - HMO | Admitting: Medical

## 2022-06-05 ENCOUNTER — Encounter: Payer: Self-pay | Admitting: Medical

## 2022-06-05 VITALS — BP 120/80 | HR 90 | Ht 65.5 in | Wt 182.4 lb

## 2022-06-05 DIAGNOSIS — J4520 Mild intermittent asthma, uncomplicated: Secondary | ICD-10-CM

## 2022-06-05 DIAGNOSIS — M79604 Pain in right leg: Secondary | ICD-10-CM

## 2022-06-05 DIAGNOSIS — H409 Unspecified glaucoma: Secondary | ICD-10-CM

## 2022-06-05 DIAGNOSIS — F341 Dysthymic disorder: Secondary | ICD-10-CM

## 2022-06-05 DIAGNOSIS — Z0000 Encounter for general adult medical examination without abnormal findings: Secondary | ICD-10-CM

## 2022-06-05 DIAGNOSIS — Z862 Personal history of diseases of the blood and blood-forming organs and certain disorders involving the immune mechanism: Secondary | ICD-10-CM | POA: Diagnosis not present

## 2022-06-05 DIAGNOSIS — M4712 Other spondylosis with myelopathy, cervical region: Secondary | ICD-10-CM

## 2022-06-05 DIAGNOSIS — K219 Gastro-esophageal reflux disease without esophagitis: Secondary | ICD-10-CM | POA: Diagnosis not present

## 2022-06-05 DIAGNOSIS — I10 Essential (primary) hypertension: Secondary | ICD-10-CM

## 2022-06-05 DIAGNOSIS — G4700 Insomnia, unspecified: Secondary | ICD-10-CM

## 2022-06-05 DIAGNOSIS — M25551 Pain in right hip: Secondary | ICD-10-CM

## 2022-06-05 DIAGNOSIS — Z136 Encounter for screening for cardiovascular disorders: Secondary | ICD-10-CM

## 2022-06-05 LAB — POCT URINALYSIS DIP (PROADVANTAGE DEVICE)
Blood, UA: NEGATIVE
Glucose, UA: NEGATIVE mg/dL
Leukocytes, UA: NEGATIVE
Nitrite, UA: NEGATIVE
Protein Ur, POC: NEGATIVE mg/dL
Specific Gravity, Urine: 1.025
Urobilinogen, Ur: NEGATIVE
pH, UA: 6 (ref 5.0–8.0)

## 2022-06-05 NOTE — Addendum Note (Signed)
Addended by: Minette Headland A on: 06/05/2022 02:46 PM   Modules accepted: Orders

## 2022-06-05 NOTE — Progress Notes (Signed)
Subjective:   HPI  Teresa Mcmahon is a 61 y.o. female who presents for Chief Complaint  Patient presents with   CPE    Cpe, came in for fasting labs this morning. No issues.     Patient Care Team: Kirti Carl, Camelia Eng, PA-C as PCP - General (Family Medicine) Shelly Bombard, MD as Consulting Physician (Obstetrics and Gynecology) Wallene Huh, DPM as Consulting Physician (Podiatry) Mcarthur Rossetti, MD as Consulting Physician (Orthopedic Surgery) Loletha Carrow Kirke Corin, MD as Consulting Physician (Gastroenterology) Sees dentist Sees eye doctor  Concerns: Here for well visit.  This is my first visit with her.  She has problems with sleep and that he.  She is on BuSpar 5 mg nightly still having trouble getting to sleep.  Her mind just will not shut down at bedtime, just lies there thinking about things  Has ongoing problems with right hip and leg pain, hurts to walk, hurts just sitting.  She stands with elevated  She notes her vaccines have been done at this pharmacy.  She declines COVID shot  Asthma-uses inhaler periodically  She is compliant with medications for blood pressure and Allergies  Reviewed their medical, surgical, family, social, medication, and allergy history and updated chart as appropriate.  Past Medical History:  Diagnosis Date   Allergy    Anemia    Anxiety    Arthritis    Asthma    Carpal tunnel syndrome    GASTROESOPHAGEAL REFLUX DISEASE 05/11/2007   Qualifier: Diagnosis of  By: Radene Ou MD, [place]      Family History  Problem Relation Age of Onset   ALS Mother    Hypertension Mother    Chronic Renal Failure Father    Hypertension Father    Hypertension Sister    Lupus Sister    Diabetes Sister    Hypertension Sister    Diabetes Sister    Hypertension Sister    Heart disease Sister    Lupus Sister    Diabetes Brother    Cancer Brother        lung   Lupus Brother    Lupus Brother    Colon cancer Other    Anesthesia  problems Neg Hx    Hypotension Neg Hx    Malignant hyperthermia Neg Hx    Pseudochol deficiency Neg Hx    Esophageal cancer Neg Hx    Rectal cancer Neg Hx    Stomach cancer Neg Hx      Current Outpatient Medications:    albuterol (VENTOLIN HFA) 108 (90 Base) MCG/ACT inhaler, ProAir HFA 90 mcg/actuation aerosol inhaler, Disp: , Rfl:    amLODipine (NORVASC) 10 MG tablet, Take 1 tablet (10 mg total) by mouth daily., Disp: 90 tablet, Rfl: 0   busPIRone (BUSPAR) 5 MG tablet, Take 1 tablet (5 mg total) by mouth at bedtime as needed., Disp: 90 tablet, Rfl: 0   ergocalciferol (VITAMIN D2) 1.25 MG (50000 UT) capsule, ergocalciferol (vitamin D2) 1,250 mcg (50,000 unit) capsule  TAKE ONE CAPSULE BY MOUTH Once weekly, Disp: , Rfl:    esomeprazole (NEXIUM) 40 MG capsule, esomeprazole magnesium 40 mg capsule,delayed release  TAKE ONE CAPSULE BY MOUTH EVERY DAY, Disp: , Rfl:    fluticasone (FLONASE) 50 MCG/ACT nasal spray, Place 2 sprays into both nostrils daily., Disp: 18 mL, Rfl: 0   Ibuprofen (MOTRIN PO), Take by mouth., Disp: , Rfl:    ipratropium (ATROVENT) 0.06 % nasal spray, Place 2 sprays into both nostrils 4 (four)  times daily. As needed for nasal congestion, runny nose, Disp: 15 mL, Rfl: 0   tiZANidine (ZANAFLEX) 4 MG tablet, tizanidine 4 mg tablet  TAKE ONE TABLET BY MOUTH TWICE DAILY, Disp: , Rfl:    cetirizine (ZYRTEC ALLERGY) 10 MG tablet, Take 1 tablet (10 mg total) by mouth at bedtime., Disp: 30 tablet, Rfl: 0  Allergies  Allergen Reactions   Tramadol Hcl Itching and Nausea Only   Latex Itching and Rash      Review of Systems Constitutional: -fever, -chills, -sweats, -unexpected weight change, -decreased appetite, -fatigue Allergy: -sneezing, -itching, -congestion Dermatology: -changing moles, --rash, -lumps ENT: -runny nose, -ear pain, -sore throat, -hoarseness, -sinus pain, -teeth pain, - ringing in ears, -hearing loss, -nosebleeds Cardiology: -chest pain, -palpitations,  -swelling, -difficulty breathing when lying flat, -waking up short of breath Respiratory: -cough, -shortness of breath, -difficulty breathing with exercise or exertion, -wheezing, -coughing up blood Gastroenterology: -abdominal pain, -nausea, -vomiting, -diarrhea, -constipation, -blood in stool, -changes in bowel movement, -difficulty swallowing or eating Hematology: -bleeding, -bruising  Musculoskeletal: +joint aches, -muscle aches, -joint swelling, -back pain, -neck pain, -cramping, -changes in gait Ophthalmology: denies vision changes, eye redness, itching, discharge Urology: -burning with urination, -difficulty urinating, -blood in urine, -urinary frequency, -urgency, -incontinence Neurology: -headache, -weakness, -tingling, -numbness, -memory loss, -falls, -dizziness Psychology: -depressed mood, -agitation, +sleep problems Breast/gyn: -breast tendnerss, -discharge, -lumps, -vaginal discharge,- irregular periods, -heavy periods      06/05/2022    1:40 PM 11/25/2021    3:48 PM 10/31/2021    9:55 AM  Depression screen PHQ 2/9  Decreased Interest 0 0 0  Down, Depressed, Hopeless 0 0 0  PHQ - 2 Score 0 0 0  Altered sleeping 1  0  Tired, decreased energy 0  0  Change in appetite 0  0  Feeling bad or failure about yourself  0  0  Trouble concentrating 0  0  Moving slowly or fidgety/restless 0  0  Suicidal thoughts 0  0  PHQ-9 Score 1  0  Difficult doing work/chores Not difficult at all         Objective:  BP 120/80   Pulse 90   Ht 5' 5.5" (1.664 m)   Wt 182 lb 6.4 oz (82.7 kg)   BMI 29.89 kg/m   General appearance: alert, no distress, WD/WN, African American female Skin: unremarkable HEENT: normocephalic, conjunctiva/corneas normal, sclerae anicteric, PERRLA, EOMi, nares patent, no discharge or erythema, pharynx normal Oral cavity: MMM, tongue normal, teeth normal Neck: anterior surgical scar, supple, no lymphadenopathy, no thyromegaly, no masses, normal ROM, no  bruits Chest: non tender, normal shape and expansion Heart: RRR, normal S1, S2, no murmurs Lungs: CTA bilaterally, no wheezes, rhonchi, or rales Abdomen: +bs, soft, non tender, non distended, no masses, no hepatomegaly, no splenomegaly, no bruits Back: non tender, normal ROM, no scoliosis Musculoskeletal: mildly reduced ROM right hipo with pain with ROM, otherwise upper extremities non tender, no obvious deformity, normal ROM throughout, lower extremities non tender, no obvious deformity, normal ROM throughout Extremities: no edema, no cyanosis, no clubbing Pulses: 2+ symmetric, upper and lower extremities, normal cap refill Neurological: alert, oriented x 3, CN2-12 intact, strength normal upper extremities and lower extremities, sensation normal throughout, DTRs 2+ throughout, no cerebellar signs, gait normal Psychiatric: normal affect, behavior normal, pleasant  Breast/gyn/rectal - deferred to gynecology     Assessment and Plan :   Encounter Diagnoses  Name Primary?   Encounter for health maintenance examination in adult Yes   Mild  intermittent asthma, unspecified whether complicated    GERD without esophagitis    Primary hypertension    ANEMIA, DEFICIENCY, HX OF    ANXIETY DEPRESSION    Screening for heart disease    Right hip pain    Right leg pain    Insomnia, unspecified type    Glaucoma, unspecified glaucoma type, unspecified laterality    Cervical spondylosis with myelopathy      This visit was a preventative care visit, also known as wellness visit or routine physical.   Topics typically include healthy lifestyle, diet, exercise, preventative care, vaccinations, sick and well care, proper use of emergency dept and after hours care, as well as other concerns.     Recommendations: Continue to return yearly for your annual wellness and preventative care visits.  This gives [place] a chance to discuss healthy lifestyle, exercise, vaccinations, review your chart record, and  perform screenings where appropriate.  I recommend you see your eye doctor yearly for routine vision care.  I recommend you see your dentist yearly for routine dental care including hygiene visits twice yearly.  See your gynecologist yearly for routine gynecological care.    Vaccination recommendations were reviewed Immunization History  Administered Date(s) Administered   Influenza Whole 06/08/2007, 06/08/2008, 06/05/2009, 05/16/2010   Influenza,inj,quad, With Preservative 05/10/2017   Influenza-Unspecified 05/23/2020, 05/28/2021, 05/29/2022   PFIZER(Purple Top)SARS-COV-2 Vaccination 03/11/2020, 04/04/2020   Pneumococcal Polysaccharide-23 11/11/2009, 06/24/2018   Td 08/10/2002   She notes Td 2019, shingles prior at [employer], and declines covid vaccine   Screening for cancer: Colon cancer screening: I reviewed your colonoscopy on file that is up to date from 03/2022  Breast cancer screening: You should perform a self breast exam monthly.   We reviewed recommendations for regular mammograms and breast cancer screening.  Cervical cancer screening: We reviewed recommendations for pap smear screening.  Pap UTD   Skin cancer screening: Check your skin regularly for new changes, growing lesions, or other lesions of concern Come in for evaluation if you have skin lesions of concern.  Lung cancer screening: If you have a greater than 20 pack year history of tobacco use, then you may qualify for lung cancer screening with a chest CT scan.   Please call your insurance company to inquire about coverage for this test.  We currently don't have screenings for other cancers besides breast, cervical, colon, and lung cancers.  If you have a strong family history of cancer or have other cancer screening concerns, please let me know.    Bone health: Get at least 150 minutes of aerobic exercise weekly Get weight bearing exercise at least once weekly Bone density test:  A bone density  test is an imaging test that uses a type of X-ray to measure the amount of calcium and other minerals in your bones. The test may be used to diagnose or screen you for a condition that causes weak or thin bones (osteoporosis), predict your risk for a broken bone (fracture), or determine how well your osteoporosis treatment is working. The bone density test is recommended for females 54 and older, or females or males <63 if certain risk factors such as thyroid disease, long term use of steroids such as for asthma or rheumatological issues, vitamin D deficiency, estrogen deficiency, family history of osteoporosis, self or family history of fragility fracture in first degree relative.    Heart health: Get at least 150 minutes of aerobic exercise weekly Limit alcohol It is important to maintain a healthy  blood pressure and healthy cholesterol numbers  Heart disease screening: Screening for heart disease includes screening for blood pressure, fasting lipids, glucose/diabetes screening, BMI height to weight ratio, reviewed of smoking status, physical activity, and diet.    Goals include blood pressure 120/80 or less, maintaining a healthy lipid/cholesterol profile, preventing diabetes or keeping diabetes numbers under good control, not smoking or using tobacco products, exercising most days per week or at least 150 minutes per week of exercise, and eating healthy variety of fruits and vegetables, healthy oils, and avoiding unhealthy food choices like fried food, fast food, high sugar and high cholesterol foods.    CT coronary heart test ordered, expect a phone call to schedule   Medical care options: I recommend you continue to seek care here first for routine care.  We try really hard to have available appointments Monday through Friday daytime hours for sick visits, acute visits, and physicals.  Urgent care should be used for after hours and weekends for significant issues that cannot wait till the  next day.  The emergency department should be used for significant potentially life-threatening emergencies.  The emergency department is expensive, can often have long wait times for less significant concerns, so try to utilize primary care, urgent care, or telemedicine when possible to avoid unnecessary trips to the emergency department.  Virtual visits and telemedicine have been introduced since the pandemic started in 2020, and can be convenient ways to receive medical care.  We offer virtual appointments as well to assist you in a variety of options to seek medical care.   Advanced Directives: I recommend you consider completing a [place] and Living Will.   These documents respect your wishes and help alleviate burdens on your loved ones if you were to become terminally ill or be in a position to need those documents enforced.    You can complete Advanced Directives yourself, have them notarized, then have copies made for our office, for you and for anybody you feel should have them in safe keeping.  Or, you can have an attorney prepare these documents.   If you haven't updated your Last Will and Testament in a while, it may be worthwhile having an attorney prepare these documents together and save on some costs.       Separate significant issues discussed: Hypertension-continue current medication amlodipine 10 mg daily  Anxiety, insomnia-work on stress reduction, can increase to 10 mg nightly.  She will try this with this medication she has  We discussed ways to deal with stress and anxiety. I recommend regular exercise such as 30 minutes or more most days of the week such as walking running and bicycling I recommend taking some time to meditate or pray daily to help slow racing thoughts. I recommend working on relaxation techniques such as deep breathing exercises in a comfortable position relaxing your body.  There are free Apps on the smart phone for this for  example Consider getting a massage Journal or use diary to express your ideas on paper to cope with anxiety and stress Work on time management, use a calendar or plan out things to avoid stressing about things. Find ways to utilize your time to include exercise and personal "me" time. Some people use aromatherapy such as lavender to relax Some people use herbal teas to help calm their mood Spend some time with animals or your pet if you have one Consider seeing a counselor to help deal with anxiety and work  on specific techniques   Asthma, intermittent-uses inhaler periodically.  We discussed preventative versus emergency therapy  Allergic rhinitis continue current medication  GERD-currently on Nexium.  Long-term use of this medicine can affect absorption and can lead to osteoporosis.  Consider other therapies   Rital was seen today for cpe.  Diagnoses and all orders for this visit:  Encounter for health maintenance examination in adult -     Lipid panel -     Hepatitis C antibody -     VITAMIN D 25 Hydroxy (Vit-D Deficiency, Fractures) -     CBC with Differential/Platelet -     TSH -     CT CARDIAC SCORING ([facility] LOCATIONS ONLY); Future  Mild intermittent asthma, unspecified whether complicated  GERD without esophagitis  Primary hypertension -     Lipid panel  ANEMIA, DEFICIENCY, HX OF  ANXIETY DEPRESSION -     TSH  Screening for heart disease -     CT CARDIAC SCORING ([facility] LOCATIONS ONLY); Future  Right hip pain  Right leg pain  Insomnia, unspecified type  Glaucoma, unspecified glaucoma type, unspecified laterality  Cervical spondylosis with myelopathy   Follow-up pending labs, yearly for physical

## 2022-06-05 NOTE — Patient Instructions (Signed)
This visit was a preventative care visit, also known as wellness visit or routine physical.   Topics typically include healthy lifestyle, diet, exercise, preventative care, vaccinations, sick and well care, proper use of emergency dept and after hours care, as well as other concerns.     Recommendations: Continue to return yearly for your annual wellness and preventative care visits.  This gives [place] a chance to discuss healthy lifestyle, exercise, vaccinations, review your chart record, and perform screenings where appropriate.  I recommend you see your eye doctor yearly for routine vision care.  I recommend you see your dentist yearly for routine dental care including hygiene visits twice yearly.  See your gynecologist yearly for routine gynecological care.    Vaccination recommendations were reviewed Immunization History  Administered Date(s) Administered   Influenza Whole 06/08/2007, 06/08/2008, 06/05/2009, 05/16/2010   Influenza,inj,quad, With Preservative 05/10/2017   Influenza-Unspecified 05/23/2020, 05/28/2021, 05/29/2022   PFIZER(Purple Top)SARS-COV-2 Vaccination 03/11/2020, 04/04/2020   Pneumococcal Polysaccharide-23 11/11/2009, 06/24/2018   Td 08/10/2002   She notes Td 2019, shingles prior at [employer], and declines covid vaccine   Screening for cancer: Colon cancer screening: I reviewed your colonoscopy on file that is up to date from 03/2022  Breast cancer screening: You should perform a self breast exam monthly.   We reviewed recommendations for regular mammograms and breast cancer screening.  Cervical cancer screening: We reviewed recommendations for pap smear screening.  Pap UTD   Skin cancer screening: Check your skin regularly for new changes, growing lesions, or other lesions of concern Come in for evaluation if you have skin lesions of concern.  Lung cancer screening: If you have a greater than 20 pack year history of tobacco use, then you may  qualify for lung cancer screening with a chest CT scan.   Please call your insurance company to inquire about coverage for this test.  We currently don't have screenings for other cancers besides breast, cervical, colon, and lung cancers.  If you have a strong family history of cancer or have other cancer screening concerns, please let me know.    Bone health: Get at least 150 minutes of aerobic exercise weekly Get weight bearing exercise at least once weekly Bone density test:  A bone density test is an imaging test that uses a type of X-ray to measure the amount of calcium and other minerals in your bones. The test may be used to diagnose or screen you for a condition that causes weak or thin bones (osteoporosis), predict your risk for a broken bone (fracture), or determine how well your osteoporosis treatment is working. The bone density test is recommended for females 95 and older, or females or males <08 if certain risk factors such as thyroid disease, long term use of steroids such as for asthma or rheumatological issues, vitamin D deficiency, estrogen deficiency, family history of osteoporosis, self or family history of fragility fracture in first degree relative.    Heart health: Get at least 150 minutes of aerobic exercise weekly Limit alcohol It is important to maintain a healthy blood pressure and healthy cholesterol numbers  Heart disease screening: Screening for heart disease includes screening for blood pressure, fasting lipids, glucose/diabetes screening, BMI height to weight ratio, reviewed of smoking status, physical activity, and diet.    Goals include blood pressure 120/80 or less, maintaining a healthy lipid/cholesterol profile, preventing diabetes or keeping diabetes numbers under good control, not smoking or using tobacco products, exercising most days per week or at least 150  minutes per week of exercise, and eating healthy variety of fruits and vegetables, healthy oils,  and avoiding unhealthy food choices like fried food, fast food, high sugar and high cholesterol foods.    CT coronary heart test ordered, expect a phone call to schedule   Medical care options: I recommend you continue to seek care here first for routine care.  We try really hard to have available appointments Monday through Friday daytime hours for sick visits, acute visits, and physicals.  Urgent care should be used for after hours and weekends for significant issues that cannot wait till the next day.  The emergency department should be used for significant potentially life-threatening emergencies.  The emergency department is expensive, can often have long wait times for less significant concerns, so try to utilize primary care, urgent care, or telemedicine when possible to avoid unnecessary trips to the emergency department.  Virtual visits and telemedicine have been introduced since the pandemic started in 2020, and can be convenient ways to receive medical care.  We offer virtual appointments as well to assist you in a variety of options to seek medical care.   Advanced Directives: I recommend you consider completing a [place] and Living Will.   These documents respect your wishes and help alleviate burdens on your loved ones if you were to become terminally ill or be in a position to need those documents enforced.    You can complete Advanced Directives yourself, have them notarized, then have copies made for our office, for you and for anybody you feel should have them in safe keeping.  Or, you can have an attorney prepare these documents.   If you haven't updated your Last Will and Testament in a while, it may be worthwhile having an attorney prepare these documents together and save on some costs.       Separate significant issues discussed: Hypertension-continue current medication amlodipine 10 mg daily  Anxiety, insomnia-work on stress reduction, can increase to  10 mg nightly.  She will try this with this medication she has  We discussed ways to deal with stress and anxiety. I recommend regular exercise such as 30 minutes or more most days of the week such as walking running and bicycling I recommend taking some time to meditate or pray daily to help slow racing thoughts. I recommend working on relaxation techniques such as deep breathing exercises in a comfortable position relaxing your body.  There are free Apps on the smart phone for this for example Consider getting a massage Journal or use diary to express your ideas on paper to cope with anxiety and stress Work on time management, use a calendar or plan out things to avoid stressing about things. Find ways to utilize your time to include exercise and personal "me" time. Some people use aromatherapy such as lavender to relax Some people use herbal teas to help calm their mood Spend some time with animals or your pet if you have one Consider seeing a counselor to help deal with anxiety and work on specific techniques   Asthma, intermittent-uses inhaler periodically.  We discussed preventative versus emergency therapy  Allergic rhinitis continue current medication  GERD-currently on Nexium.  Long-term use of this medicine can affect absorption and can lead to osteoporosis.  Consider other therapies

## 2022-06-06 LAB — TSH: TSH: 1.66 u[IU]/mL (ref 0.450–4.500)

## 2022-06-06 LAB — HEPATITIS C ANTIBODY: Hep C Virus Ab: NONREACTIVE

## 2022-06-06 LAB — LIPID PANEL
Chol/HDL Ratio: 2.6 ratio (ref 0.0–4.4)
Cholesterol, Total: 198 mg/dL (ref 100–199)
HDL: 75 mg/dL (ref >39)
LDL Chol Calc (NIH): 112 mg/dL — ABNORMAL HIGH (ref 0–99)
Triglycerides: 60 mg/dL (ref 0–149)
VLDL Cholesterol Cal: 11 mg/dL (ref 5–40)

## 2022-06-06 LAB — CBC WITH DIFFERENTIAL/PLATELET
Basophils Absolute: 0 10*3/uL (ref 0.0–0.2)
Basos: 0 %
EOS (ABSOLUTE): 0.1 10*3/uL (ref 0.0–0.4)
Eos: 2 %
Hematocrit: 34.9 % (ref 34.0–46.6)
Hemoglobin: 10.6 g/dL — ABNORMAL LOW (ref 11.1–15.9)
Immature Grans (Abs): 0 10*3/uL (ref 0.0–0.1)
Immature Granulocytes: 0 %
Lymphocytes Absolute: 1.8 10*3/uL (ref 0.7–3.1)
Lymphs: 29 %
MCH: 22.2 pg — ABNORMAL LOW (ref 26.6–33.0)
MCHC: 30.4 g/dL — ABNORMAL LOW (ref 31.5–35.7)
MCV: 73 fL — ABNORMAL LOW (ref 79–97)
Monocytes Absolute: 0.5 10*3/uL (ref 0.1–0.9)
Monocytes: 8 %
Neutrophils Absolute: 3.9 10*3/uL (ref 1.4–7.0)
Neutrophils: 61 %
Platelets: 235 10*3/uL (ref 150–450)
RBC: 4.78 x10E6/uL (ref 3.77–5.28)
RDW: 15.4 % (ref 11.7–15.4)
WBC: 6.3 10*3/uL (ref 3.4–10.8)

## 2022-06-06 LAB — VITAMIN D 25 HYDROXY (VIT D DEFICIENCY, FRACTURES): Vit D, 25-Hydroxy: 70 ng/mL (ref 30.0–100.0)

## 2022-06-08 ENCOUNTER — Other Ambulatory Visit: Payer: Self-pay | Admitting: Medical

## 2022-06-09 ENCOUNTER — Other Ambulatory Visit: Payer: Self-pay | Admitting: Medical

## 2022-06-09 MED ORDER — FERROUS GLUCONATE 324 (38 FE) MG PO TABS: 324.0000 mg | ORAL_TABLET | Freq: Every day | ORAL | 1 refills | Status: AC

## 2022-06-10 ENCOUNTER — Other Ambulatory Visit: Payer: Commercial Managed Care - HMO

## 2022-06-11 ENCOUNTER — Ambulatory Visit: Payer: Commercial Managed Care - HMO | Admitting: Podiatry

## 2022-06-12 ENCOUNTER — Ambulatory Visit
Admission: RE | Admit: 2022-06-12 | Discharge: 2022-06-12 | Disposition: A | Discharge status: Home / Self Care | Payer: No Typology Code available for payment source | Source: Ambulatory Visit | Attending: Medical | Admitting: Medical

## 2022-06-12 DIAGNOSIS — Z136 Encounter for screening for cardiovascular disorders: Secondary | ICD-10-CM

## 2022-06-12 DIAGNOSIS — Z0000 Encounter for general adult medical examination without abnormal findings: Secondary | ICD-10-CM

## 2022-06-16 ENCOUNTER — Telehealth: Payer: Self-pay | Admitting: Medical

## 2022-06-16 NOTE — Telephone Encounter (Signed)
Pt called and seen your notes about her lab results. She says she is ok with being on cholesterol medicine and she prefers [place], [place]. Also she is ok with a referral to cardiology.

## 2022-06-17 ENCOUNTER — Other Ambulatory Visit: Payer: Self-pay | Admitting: Medical

## 2022-06-17 DIAGNOSIS — I10 Essential (primary) hypertension: Secondary | ICD-10-CM

## 2022-06-17 DIAGNOSIS — I2510 Atherosclerotic heart disease of native coronary artery without angina pectoris: Secondary | ICD-10-CM

## 2022-06-17 MED ORDER — ROSUVASTATIN CALCIUM 10 MG PO TABS: 10.0000 mg | ORAL_TABLET | Freq: Every day | ORAL | 3 refills | Status: AC

## 2022-06-17 MED ORDER — ROSUVASTATIN CALCIUM 10 MG PO TABS: 10.0000 mg | ORAL_TABLET | Freq: Every day | ORAL | 3 refills | Status: DC

## 2022-06-19 ENCOUNTER — Ambulatory Visit: Payer: Commercial Managed Care - HMO | Attending: Cardiology | Admitting: Cardiology

## 2022-06-19 ENCOUNTER — Encounter: Payer: Self-pay | Admitting: Cardiology

## 2022-06-19 VITALS — BP 126/74 | HR 70 | Ht 65.5 in | Wt 183.4 lb

## 2022-06-19 DIAGNOSIS — E7800 Pure hypercholesterolemia, unspecified: Secondary | ICD-10-CM | POA: Diagnosis not present

## 2022-06-19 DIAGNOSIS — R079 Chest pain, unspecified: Secondary | ICD-10-CM | POA: Diagnosis not present

## 2022-06-19 DIAGNOSIS — R931 Abnormal findings on diagnostic imaging of heart and coronary circulation: Secondary | ICD-10-CM | POA: Diagnosis not present

## 2022-06-19 DIAGNOSIS — I10 Essential (primary) hypertension: Secondary | ICD-10-CM

## 2022-06-19 MED ORDER — ASPIRIN 81 MG PO TBEC: 81.0000 mg | DELAYED_RELEASE_TABLET | Freq: Every day | ORAL | 3 refills | Status: AC

## 2022-06-19 NOTE — Addendum Note (Signed)
Addended by: Fransico Him R on: 06/19/2022 01:20 PM   Modules accepted: Orders

## 2022-06-19 NOTE — Patient Instructions (Addendum)
Medication Instructions:  Your physician has recommended you make the following change in your medication:  1) START taking aspirin 81 mg daily *If you need a refill on your cardiac medications before your next appointment, please call your pharmacy*   Lab Work: IN 6 WEEKS: Fasting lipids and ALT If you have labs (blood work) drawn today and your tests are completely normal, you will receive your results only by: [facility] (if you have MyChart) OR A paper copy in the mail If you have any lab test that is abnormal or we need to change your treatment, we will call you to review the results.  Testing/Procedures: Your provider has recommended that you have a cardiac PET/CT stress test. Please see below for further instructions.   Follow-Up: At [facility], you and your health needs are our priority.  As part of our continuing mission to provide you with exceptional heart care, we have created designated Provider Care Teams.  These Care Teams include your primary Cardiologist (physician) and Advanced Practice Providers (APPs -  Physician Assistants and Nurse Practitioners) who all work together to provide you with the care you need, when you need it.  Your next appointment:   1 year(s)  The format for your next appointment:   In Person  Provider:   Fransico Him, MD     Other Instructions How to Prepare for Your Cardiac PET/CT Stress Test:  1. Please do not take these medications before your test:   Medications that may interfere with the cardiac pharmacological stress agent (ex. nitrates - including erectile dysfunction medications or beta-blockers) the day of the exam. (Erectile dysfunction medication should be held for at least 72 hrs prior to test) Theophylline containing medications for 12 hours. Dipyridamole 48 hours prior to the test. Your remaining medications may be taken with water.  2. Nothing to eat or drink, except water, 3 hours prior to arrival time.    NO caffeine/decaffeinated products, or chocolate 12 hours prior to arrival.  3. NO perfume, cologne or lotion  4. Total time is 1 to 2 hours; you may want to bring reading material for the waiting time.  5. Please report to Admitting at the [facility] Entrance 60 minutes early for your test.  [place],  [id number]   IF YOU THINK YOU MAY BE PREGNANT, OR ARE NURSING PLEASE INFORM THE TECHNOLOGIST.  In preparation for your appointment, medication and supplies will be purchased.  Appointment availability is limited, so if you need to cancel or reschedule, please call the Radiology Department at [phone]  24 hours in advance to avoid a cancellation fee of $100.00  What to Expect After you Arrive:  Once you arrive and check in for your appointment, you will be taken to a preparation room within the Radiology Department.  A technologist or Nurse will obtain your medical history, verify that you are correctly prepped for the exam, and explain the procedure.  Afterwards,  an IV will be started in your arm and electrodes will be placed on your skin for EKG monitoring during the stress portion of the exam. Then you will be escorted to the PET/CT scanner.  There, staff will get you positioned on the scanner and obtain a blood pressure and EKG.  During the exam, you will continue to be connected to the EKG and blood pressure machines.  A small, safe amount of a radioactive tracer will be injected in your IV to obtain a  series of pictures of your heart along with an injection of a stress agent.    After your Exam:  It is recommended that you eat a meal and drink a caffeinated beverage to counter act any effects of the stress agent.  Drink plenty of fluids for the remainder of the day and urinate frequently for the first couple of hours after the exam.  Your doctor will inform you of your test results within 7-10 business days.  For questions about your test or how  to prepare for your test, please call: Marchia Bond, Cardiac Imaging Nurse Navigator  Gordy Clement, Cardiac Imaging Nurse Navigator Office: [phone]   Important Information About Sugar

## 2022-06-19 NOTE — Progress Notes (Signed)
Cardiology CONSULT Note    Date:  06/19/2022   ID:  Sparkles, Mcneely May 17, 1961, MRN [id number]  PCP:  Carlena Hurl, PA-C  Cardiologist:  Fransico Him, MD   Chief Complaint  Patient presents with   New Patient (Initial Visit)    Coronary artery calcifications    History of Present Illness:  Teresa Mcmahon is a 61 y.o. female who is being seen today for the evaluation of coronary artery calcifications at the request of Carlena Hurl, PA-C.  This is a 61 year old African-American female with a history of anxiety, arthritis, asthma, GERD who recently had a coronary calcium score to assess cardiac risk which showed a total score of 205 with mainly calcification in the LAD at 195.  This was 93rd percentile for age and sex matched controls.  She says that 1-2 times weekly she will have pressure in her chest midsternal with no radiation.  There is no nausea, diaphoresis but does get SOB with the discomfort. It usually lasts a minutes and goes away.  It resolves with rest and is exertional. She denies any SOB, DOE, PND, orthopnea, LE edema, dizziness, palpitations or syncope. She is compliant with her meds and is tolerating meds with no SE.    Past Medical History:  Diagnosis Date   Agatston coronary artery calcium score between 200 and 399    Score 205 on 06/2022   Allergy    Anemia    Anxiety    Arthritis    Asthma    Carpal tunnel syndrome    GASTROESOPHAGEAL REFLUX DISEASE 05/11/2007   Qualifier: Diagnosis of  By: Radene Ou MD, [place]      Past Surgical History:  Procedure Laterality Date   ANTERIOR CRUCIATE LIGAMENT REPAIR Left    2018   BACK SURGERY     COLONOSCOPY  07/20/2011   Procedure: COLONOSCOPY;  Surgeon: Jeryl Columbia, MD;  Location: [facility] ENDOSCOPY;  Service: Endoscopy;  Laterality: N/A;   COLONOSCOPY  03/2022   mult polyps, lipoma.  Dr. Wilfrid Lund   ENDOMETRIAL ABLATION  2006   NECK SURGERY     x3    Current  Medications: Current Meds  Medication Sig   albuterol (VENTOLIN HFA) 108 (90 Base) MCG/ACT inhaler ProAir HFA 90 mcg/actuation aerosol inhaler   amLODipine (NORVASC) 10 MG tablet Take 1 tablet (10 mg total) by mouth daily.   busPIRone (BUSPAR) 5 MG tablet Take 1 tablet (5 mg total) by mouth at bedtime as needed.   ergocalciferol (VITAMIN D2) 1.25 MG (50000 UT) capsule ergocalciferol (vitamin D2) 1,250 mcg (50,000 unit) capsule  TAKE ONE CAPSULE BY MOUTH Once weekly   esomeprazole (NEXIUM) 40 MG capsule esomeprazole magnesium 40 mg capsule,delayed release  TAKE ONE CAPSULE BY MOUTH EVERY DAY   ferrous gluconate (FERGON) 324 MG tablet Take 1 tablet (324 mg total) by mouth daily with breakfast.   fluticasone (FLONASE) 50 MCG/ACT nasal spray Place 2 sprays into both nostrils daily.   Ibuprofen (MOTRIN PO) Take by mouth.   ipratropium (ATROVENT) 0.06 % nasal spray Place 2 sprays into both nostrils 4 (four) times daily. As needed for nasal congestion, runny nose   rosuvastatin (CRESTOR) 10 MG tablet Take 1 tablet (10 mg total) by mouth daily.    Allergies:   Tramadol hcl, Trazodone and nefazodone, and Latex   Social History   Socioeconomic History   Marital status: Married    Spouse name: Not on file   Number of children:  2   Years of education: Not on file   Highest education level: Not on file  Occupational History   Occupation: Secretary/administrator  Tobacco Use   Smoking status: Never   Smokeless tobacco: Never  Vaping Use   Vaping Use: Never used  Substance and Sexual Activity   Alcohol use: Yes    Comment: once a month a beer or 2   Drug use: No   Sexual activity: Yes    Birth control/protection: Post-menopausal  Other Topics Concern   Not on file  Social History Narrative   Lives with husband, has 2 biological and 1 adopted.   Works as a Nurse, mental health.  Exercise - work, on feet all day.  05/2022.     Social Determinants of Health   Financial Resource Strain: Not on file  Food  Insecurity: Not on file  Transportation Needs: Not on file  Physical Activity: Not on file  Stress: Not on file  Social Connections: Not on file     Family History:  The patient's family history includes ALS in her mother; Cancer in her brother; Chronic Renal Failure in her father; Colon cancer in an other family member; Diabetes in her brother, sister, and sister; Heart disease in her sister; Hypertension in her father, mother, sister, sister, and sister; Lupus in her brother, brother, sister, and sister.   ROS:   Please see the history of present illness.    ROS All other systems reviewed and are negative.      No data to display             PHYSICAL EXAM:   VS:  BP 126/74   Pulse 70   Ht 5' 5.5" (1.664 m)   Wt 183 lb 6.4 oz (83.2 kg)   SpO2 98%   BMI 30.06 kg/m    GEN: Well nourished, well developed, in no acute distress  HEENT: normal  Neck: no JVD, carotid bruits, or masses Cardiac: RRR; no murmurs, rubs, or gallops,no edema.  Intact distal pulses bilaterally.  Respiratory:  clear to auscultation bilaterally, normal work of breathing GI: soft, nontender, nondistended, + BS MS: no deformity or atrophy  Skin: warm and dry, no rash Neuro:  Alert and Oriented x 3, Strength and sensation are intact Psych: euthymic mood, full affect  Wt Readings from Last 3 Encounters:  06/19/22 183 lb 6.4 oz (83.2 kg)  06/05/22 182 lb 6.4 oz (82.7 kg)  03/20/22 182 lb (82.6 kg)      Studies/Labs Reviewed:   EKG:  EKG is ordered today.  The ekg ordered today demonstrates NSR with nonspecific T wave abnormality  Recent Labs: 02/03/2022: ALT 24; BUN 13; Creatinine, Ser 0.97; Potassium 4.1; Sodium 137 06/05/2022: Hemoglobin 10.6; Platelets 235; TSH 1.660   Lipid Panel    Component Value Date/Time   CHOL 198 06/05/2022 0900   TRIG 60 06/05/2022 0900   HDL 75 06/05/2022 0900   CHOLHDL 2.6 06/05/2022 0900   CHOLHDL 2.4 Ratio 10/24/2008 2152   VLDL 15 10/24/2008 2152   LDLCALC  112 (H) 06/05/2022 0900    Additional studies/ records that were reviewed today include:  Coronary calcium score and office notes from PCP    ASSESSMENT:    1. Agatston coronary artery calcium score between 200 and 399   2. Pure hypercholesterolemia   3. Primary hypertension      PLAN:  In order of problems listed above:  Coronary artery calcifications -195 which was 93rd percentile for age  and sex matched controls.  Coronary calcium score recently done was elevated at 205 with primarily calcifications in the LAD with an LAD score of 195. -She has been having exertional chest heaviness associated with SOB that resolves with rest and EKG shows nonspecific T wave abnormality so recommend Stress PET CT -Shared Decision Making/Informed Consent The risks [chest pain, shortness of breath, cardiac arrhythmias, dizziness, blood pressure fluctuations, myocardial infarction, stroke/transient ischemic attack, nausea, vomiting, allergic reaction, radiation exposure, metallic taste sensation and life-threatening complications (estimated to be 1 in 10,000)], benefits (risk stratification, diagnosing coronary artery disease, treatment guidance) and alternatives of a cardiac PET stress test were discussed in detail with Teresa Mcmahon and she agrees to proceed. -Needs aggressive risk factor modification -Start aspirin 81 mg daily  2.  Hyperlipidemia -LDL goal less than 70 -I have personally reviewed and interpreted outside labs performed by patient's PCP which showed LDL 112 and HDL 75 on 06/05/2022 -She was started on Crestor at 10 mg daily yesterday and therefore will  repeat FLP and ALT in 6 weeks  3.  Hypertension -BP controlled on exam -Continue prescription drug management with amlodipine 10 mg daily with as needed refills  Time Spent: 20 minutes total time of encounter, including 15 minutes spent in face-to-face patient care on the date of this encounter. This time includes coordination of  care and counseling regarding above mentioned problem list. Remainder of non-face-to-face time involved reviewing chart documents/testing relevant to the patient encounter and documentation in the medical record. I have independently reviewed documentation from referring provider  Medication Adjustments/Labs and Tests Ordered: Current medicines are reviewed at length with the patient today.  Concerns regarding medicines are outlined above.  Medication changes, Labs and Tests ordered today are listed in the Patient Instructions below.  There are no Patient Instructions on file for this visit.   Signed, Fransico Him, MD  06/19/2022 12:53 PM    [address], [place], [place]  [address] Phone: [phone]; Fax: [phone]

## 2022-06-19 NOTE — Addendum Note (Signed)
Addended by: Bernestine Amass on: 06/19/2022 01:12 PM   Modules accepted: Orders

## 2022-06-23 ENCOUNTER — Other Ambulatory Visit: Payer: Self-pay | Admitting: Medical

## 2022-06-23 ENCOUNTER — Other Ambulatory Visit: Payer: Self-pay | Admitting: Physician Assistant

## 2022-06-23 NOTE — Telephone Encounter (Signed)
Is this okay to refill? 

## 2022-06-29 ENCOUNTER — Ambulatory Visit (INDEPENDENT_AMBULATORY_CARE_PROVIDER_SITE_OTHER): Payer: Commercial Managed Care - HMO

## 2022-06-29 ENCOUNTER — Encounter: Payer: Self-pay | Admitting: Orthopaedic Surgery

## 2022-06-29 ENCOUNTER — Ambulatory Visit: Payer: Commercial Managed Care - HMO | Admitting: Orthopaedic Surgery

## 2022-06-29 DIAGNOSIS — M79604 Pain in right leg: Secondary | ICD-10-CM

## 2022-06-29 DIAGNOSIS — M5441 Lumbago with sciatica, right side: Secondary | ICD-10-CM | POA: Diagnosis not present

## 2022-06-29 MED ORDER — CELECOXIB 200 MG PO CAPS: 200.0000 mg | ORAL_CAPSULE | Freq: Two times a day (BID) | ORAL | 1 refills | Status: DC | PRN

## 2022-06-29 MED ORDER — METHYLPREDNISOLONE 4 MG PO TABS: ORAL_TABLET | ORAL | 0 refills | Status: AC

## 2022-06-29 MED ORDER — METHOCARBAMOL 500 MG PO TABS: 500.0000 mg | ORAL_TABLET | Freq: Four times a day (QID) | ORAL | 1 refills | Status: DC | PRN

## 2022-06-29 NOTE — Progress Notes (Signed)
The patient is somewhat of seen before.  She is 61 years old and very active.  She does work at a job where she stands all day.  She has been dealing with right-sided sciatica and low back pain that comes and goes for the last 2 months but is getting worse.  She has tried over-the-counter pain patches and this seems to not be getting better for her.  On exam she does have a positive straight leg raise to the right side.  It is very uncomfortable for her.  She has pain with flexion extension of the lumbar spine and she can only bend over to reach down to her ankle levels.  There is no numbness and tingling and no weakness but is deftly painful for her.  Her hip exam is entirely normal on the right side.  AP pelvis and lateral right hip shows normal-appearing hips bilaterally.  2 views of the lumbar spine show degenerative changes at several levels.  I would like to try combination of outpatient physical therapy with any modalities that can help decrease her pain as it relates to right-sided low back pain and sciatica.  Also try a steroid taper combined with Celebrex and Robaxin.  She will start the Celebrex after the steroid taper.  I will then like to see her back in 4 weeks to see how she is doing overall.  She agrees with this treatment plan.  All questions and concerns were answered and addressed.

## 2022-06-30 ENCOUNTER — Other Ambulatory Visit: Payer: Self-pay

## 2022-06-30 DIAGNOSIS — M5441 Lumbago with sciatica, right side: Secondary | ICD-10-CM

## 2022-06-30 DIAGNOSIS — M79604 Pain in right leg: Secondary | ICD-10-CM

## 2022-07-16 ENCOUNTER — Ambulatory Visit: Payer: Commercial Managed Care - HMO | Admitting: Physical Therapy

## 2022-07-17 ENCOUNTER — Other Ambulatory Visit: Payer: Self-pay | Admitting: Orthopaedic Surgery

## 2022-07-23 NOTE — Therapy (Signed)
OUTPATIENT PHYSICAL THERAPY THORACOLUMBAR EVALUATION   Patient Name: Teresa Mcmahon MRN: [id number] DOB:[birth year], 61 y.o., female Today's Date: 07/24/2022  END OF SESSION:  PT End of Session - 07/24/22 1049     Visit Number 1    Number of Visits 16    Date for PT Re-Evaluation 09/18/22    Authorization Type [employer] [place] [employer]    PT Start Time 1055    PT Stop Time 1135    PT Time Calculation (min) 40 min    Activity Tolerance Patient tolerated treatment well    Behavior During Therapy [facility] for tasks assessed/performed             Past Medical History:  Diagnosis Date   Agatston coronary artery calcium score between 200 and 399    Score 205 on 06/2022   Allergy    Anemia    Anxiety    Arthritis    Asthma    Carpal tunnel syndrome    GASTROESOPHAGEAL REFLUX DISEASE 05/11/2007   Qualifier: Diagnosis of  By: Radene Ou MD, [place]     Past Surgical History:  Procedure Laterality Date   ANTERIOR CRUCIATE LIGAMENT REPAIR Left    2018   BACK SURGERY     COLONOSCOPY  07/20/2011   Procedure: COLONOSCOPY;  Surgeon: Jeryl Columbia, MD;  Location: [facility] ENDOSCOPY;  Service: Endoscopy;  Laterality: N/A;   COLONOSCOPY  03/2022   mult polyps, lipoma.  Dr. Wilfrid Lund   ENDOMETRIAL ABLATION  2006   [place]     x3   Patient Active Problem List   Diagnosis Date Noted   Agatston coronary artery calcium score between 200 and 399 06/19/2022   Encounter for health maintenance examination in adult 06/05/2022   Mild intermittent asthma 06/05/2022   GERD without esophagitis 11/25/2021   Primary hypertension 11/25/2021   Carpal tunnel syndrome of left wrist 10/29/2020   Carpal tunnel syndrome of right wrist 10/29/2020   Trigger thumb of left hand 12/23/2017   Osteoarthritis of carpometacarpal ([place]) joint of thumb 10/08/2017   Arthritis of carpometacarpal ([place]) joint of right thumb 10/08/2017   Status post arthroscopy of left knee 06/15/2016   Cervical spondylosis with  myelopathy 06/05/2015   ANEMIA, DEFICIENCY, HX OF 11/11/2009   Unspecified glaucoma 10/24/2008   ANXIETY DEPRESSION 05/11/2007   HEADACHE, TENSION 05/11/2007    PCP: Carlena Hurl, PA-C  REFERRING PROVIDER: Mcarthur Rossetti, MD  REFERRING DIAG: Pain in right leg [M79.604], Acute right-sided low back pain with right-sided sciatica [M54.41]   Rationale for Evaluation and Treatment: Rehabilitation  THERAPY DIAG:  Pain in right leg  Acute right-sided low back pain with right-sided sciatica  Pain in right hip  Muscle weakness  ONSET DATE: 6+ months   SUBJECTIVE:  SUBJECTIVE STATEMENT: Pt states that she had an insidious onset of R lower back pain that started radiating down her R LE. She reports intermittent pain at night and in the morning. She states that she has gotten cortisone shots last year with no relief. She states intermittent numbness, but very rarely. 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 She is working 4 days, but stands walks on Hospital doctor.   PERTINENT HISTORY:  Anxiety, Arthritis  PAIN:  Are you having pain? Yes: NPRS scale: 8-9/10 Pain location: R lower back into her R hip and foot.  Pain description: Sharp pain, nagging tooth ache  Aggravating factors: Pt unable to recall due to it coming on at night.  Relieving factors: Kinesiotape,    PRECAUTIONS: None  WEIGHT BEARING RESTRICTIONS: No  FALLS:  Has patient fallen in last 6 months? 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  PLOF: Independent  PATIENT GOALS: Pt would like to find a way to  alleviate the pain.   NEXT MD VISIT:   OBJECTIVE:   DIAGNOSTIC FINDINGS:  2 views of the lumbar spine show no acute findings.  There are  degenerative changes noted.   SCREENING FOR RED FLAGS: Bowel or bladder incontinence: No  COGNITION: Overall cognitive status: Within functional limits for tasks assessed     SENSATION: WFL  POSTURE: No Significant postural limitations  PALPATION: No tenderness to palpation  LUMBAR ROM:   AROM eval  Flexion Mid tibia bilat with P!  Extension WFL  Right lateral flexion Joint line  P!  Left lateral flexion Joint line P!  Right rotation WFL  Left rotation WFL   (Blank rows = not tested)  LOWER EXTREMITY ROM:     Active  Right eval Left eval  Hip flexion WFL with trunk extension due to pain WFL   Knee flexion [facility] WFL  Knee extension WFLW WFL   (Blank rows = not tested)  LOWER EXTREMITY MMT:    MMT Right eval Left eval  Hip flexion 4+ 4+  Knee flexion 5 5  Knee extension 5 5   (Blank rows = not tested)  LUMBAR SPECIAL TESTS:  Straight leg raise test: Positive  FUNCTIONAL TESTS:  5 times sit to stand: 15.50 sec   GAIT: Distance walked: 71f  Assistive device utilized: None Level of assistance: Complete Independence Comments: No gait abnormalities   TODAY'S TREATMENT:                                                                                                                              DATE: Creating, reviewing, and completing below HEP     PATIENT EDUCATION:  Education details: Educated pt on anatomy and physiology of current symptoms, FOTO, diagnosis, prognosis,  HEP,  and POC. Person educated: Patient Education method: Customer service manager Education comprehension: verbalized understanding and returned demonstration  HOME EXERCISE PROGRAM: Access Code: [email or web address] URL: [email or web address] Date: 07/24/2022 Prepared by: Rudi Heap  Exercises - Supine Lower Trunk Rotation  - 2 x  daily - 7 x weekly - 2 sets - 10 reps - 2 hold - Supine Posterior Pelvic Tilt  - 2 x daily - 7 x weekly - 2 sets - 10 reps - 2 hold - Supine Hamstring Stretch with Strap  - 2 x daily - 7 x weekly - 2 sets - 30 hold - Gastroc Stretch with Foot at [place]  - 2 x daily - 7 x weekly - 2 sets - 30 hold   ASSESSMENT:  CLINICAL IMPRESSION: Patient referred to PT for R lower back/ hip pain that radiates down her leg into her knee and foot. Pt with rotated pelvis secondary to significant muscle tension throughout R LE. Pt provided an HEP and plans to perform it at home for a few weeks prior to returning to PT PRN.  Patient will benefit from skilled PT to address below impairments, limitations and improve overall function.  OBJECTIVE IMPAIRMENTS: decreased activity tolerance, difficulty walking, decreased balance, decreased endurance, decreased mobility, decreased ROM, decreased strength, impaired flexibility, impaired LE use, postural dysfunction, and pain.  ACTIVITY LIMITATIONS: bending, lifting, carry, locomotion, cleaning, community activity, driving, and or occupation  PERSONAL FACTORS: Anxiety, Arthritis are also affecting patient's functional outcome.  REHAB POTENTIAL: Good  CLINICAL DECISION MAKING: Stable/uncomplicated  EVALUATION COMPLEXITY: Low  GOALS: Short term PT Goals Target date: 08/21/2022 Pt will report <5/10 pain with onset of pain at night and in the morning.  Baseline:   Goal status: New PLAN: PT FREQUENCY: 1 times per week   PT DURATION: 6 weeks  PLANNED INTERVENTIONS (unless contraindicated): aquatic PT, Canalith repositioning, cryotherapy, Electrical stimulation, Iontophoresis with 4 mg/ml dexamethasome, Moist heat, traction, Ultrasound, gait training, Therapeutic exercise, balance training, neuromuscular re-education, patient/family education, prosthetic training, manual techniques, passive ROM, dry needling, taping, vasopnuematic device, vestibular, spinal manipulations,  joint manipulations    PLAN FOR NEXT SESSION: Assess HEP/update PRN  Lynden Ang, PT 07/24/2022, 10:55 AM

## 2022-07-24 ENCOUNTER — Other Ambulatory Visit: Payer: Self-pay

## 2022-07-24 ENCOUNTER — Ambulatory Visit: Payer: Commercial Managed Care - HMO | Admitting: Physical Therapy

## 2022-07-24 DIAGNOSIS — M79604 Pain in right leg: Secondary | ICD-10-CM | POA: Diagnosis not present

## 2022-07-24 DIAGNOSIS — M5441 Lumbago with sciatica, right side: Secondary | ICD-10-CM

## 2022-07-24 DIAGNOSIS — M25551 Pain in right hip: Secondary | ICD-10-CM

## 2022-07-24 DIAGNOSIS — M6281 Muscle weakness (generalized): Secondary | ICD-10-CM

## 2022-07-31 ENCOUNTER — Telehealth (HOSPITAL_COMMUNITY): Payer: Self-pay | Admitting: Emergency Medicine

## 2022-07-31 ENCOUNTER — Ambulatory Visit: Payer: Commercial Managed Care - HMO | Attending: Cardiology

## 2022-07-31 ENCOUNTER — Other Ambulatory Visit: Payer: Self-pay | Admitting: Orthopaedic Surgery

## 2022-07-31 DIAGNOSIS — R931 Abnormal findings on diagnostic imaging of heart and coronary circulation: Secondary | ICD-10-CM

## 2022-07-31 DIAGNOSIS — I10 Essential (primary) hypertension: Secondary | ICD-10-CM

## 2022-07-31 DIAGNOSIS — E7800 Pure hypercholesterolemia, unspecified: Secondary | ICD-10-CM

## 2022-07-31 LAB — LIPID PANEL
Chol/HDL Ratio: 2 ratio (ref 0.0–4.4)
Cholesterol, Total: 145 mg/dL (ref 100–199)
HDL: 72 mg/dL (ref >39)
LDL Chol Calc (NIH): 61 mg/dL (ref 0–99)
Triglycerides: 57 mg/dL (ref 0–149)
VLDL Cholesterol Cal: 12 mg/dL (ref 5–40)

## 2022-07-31 LAB — ALT: ALT: 23 IU/L (ref 0–32)

## 2022-07-31 NOTE — Telephone Encounter (Signed)
Reaching out to patient to offer assistance regarding upcoming cardiac imaging study; pt verbalizes understanding of appt date/time, parking situation and where to check in, pre-test NPO status and medications ordered, and verified current allergies; name and call back number provided for further questions should they arise Teresa Bond RN Navigator Cardiac Imaging [facility] [phone] office [phone] cell  Arrival 700 Daily meds with water only, no caffeine, no food

## 2022-08-04 ENCOUNTER — Ambulatory Visit (HOSPITAL_COMMUNITY): Payer: Commercial Managed Care - HMO

## 2022-08-04 ENCOUNTER — Encounter (HOSPITAL_COMMUNITY): Payer: Self-pay

## 2022-08-05 ENCOUNTER — Ambulatory Visit: Payer: Commercial Managed Care - HMO | Admitting: Orthopaedic Surgery

## 2022-08-22 ENCOUNTER — Other Ambulatory Visit: Payer: Self-pay | Admitting: Orthopaedic Surgery

## 2022-09-29 ENCOUNTER — Telehealth: Payer: Self-pay

## 2022-09-29 NOTE — Telephone Encounter (Signed)
----- Message from Sueanne Margarita, MD sent at 09/29/2022 12:54 PM EST ----- Regarding: RE: Cardiac PET CT was denied Danae Chen please change Stress PET CT to a Stress myoview ----- Message ----- From: Sharol Harness Sent: 09/29/2022  12:17 PM EST To: Sueanne Margarita, MD Subject: RE: Cardiac PET CT was denied                  It would have be ordered for me to submit an auth ----- Message ----- From: Sueanne Margarita, MD Sent: 09/29/2022  11:55 AM EST To: Lorenza Evangelist, RN; Sharol Harness; # Subject: RE: Cardiac PET CT was denied                  Can you find out if they will pay for a Stress myoview ----- Message ----- From: Sharol Harness Sent: 09/29/2022  11:27 AM EST To: Sueanne Margarita, MD; Lorenza Evangelist, RN; # Subject: RE: Cardiac PET CT was denied                  Good morning,   Here are the denial details. The original auth and appeal were denied.  Based on [employer] Guidelines Section(s): Cardiac PET - Perfusion - Indications (CD 6.2), we cannot approve this request. Your healthcare provider told us that there is a concern related to your heart. The request cannot be approved because:  It must be needed because there is documentation that you have one of the following. -Severe obesity (such as a body mass index of 40 or higher). -Large breasts or implants. -Inability to exercise enough to reach a target heart rate due to a physical problem. 78431 1 Myocardial imaging, positron emission tomography (PET), perfusion study (including ventricular wall motion[s] and/or ejection fraction[s], when performed); multiple studies at rest and stress (exercise or pharmacologic)   Denied Following consideration of the original adverse decision, the initial decision is upheld with the same rationaleBased on [employer] Cardiac Imaging Guidelines Section(s): Cardiac PET - Perfusion - Indications (CD 6.2), we cannot approve this request. Your healthcare provider  told us that there is a concern related to the blood vessels that carry blood to your heart. The request cannot be approved because:  It must be needed because there is documentation that you have one of the following. -Severe obesity (such as a body mass index of 40 or higher). -Large breasts or implants. -Inability to exercise enough to reach a target heart rate due to a physical problem.   ----- Message ----- From: Lorenza Evangelist, RN Sent: 09/29/2022  11:14 AM EST To: Sueanne Margarita, MD; Sharol Harness; # Subject: RE: Cardiac PET CT was denied                  Hey I'm going to add anisah who precerts for NM PET CT CARDIAC .   Anisah, can you follow up to see why this Josem Kaufmann was denied?   Thank you, Marchia Bond RN Navigator Cardiac Imaging [facility] [phone] Office  [phone] Cell   ----- Message ----- From: Joni Reining, RN Sent: 09/29/2022  10:40 AM EST To: Lorenza Evangelist, RN Subject: Cardiac PET CT was denied                      Kathe Mariner,  Would you know what options we have with this or who to call? Thanks,  Danae Chen -----  Message ----- From: Sueanne Margarita, MD Sent: 09/28/2022  10:53 AM EST To: Glynis Smiles; Joni Reining, RN  Danae Chen can you please find out who precerts the coronary CTA and ask them to call insurance as this was ordered for chest pain so not sure why insurance will not cover  Traci ----- Message ----- From: Frederic Jericho Sent: 09/28/2022   9:32 AM EST To: Sueanne Margarita, MD  These are obtained by the referral and auth team for [facility] office.  ----- Message ----- From: Sueanne Margarita, MD Sent: 09/26/2022   9:11 AM EST To: Haig Prophet  That doe not make sense - she is having chest pain - can you please check with insurance as they should pay for chest pain - make sure the ICD code subitted was correct ----- Message ----- From: Frederic Jericho Sent: 09/25/2022   1:14 PM  EST To: Sueanne Margarita, MD; Sunday Shams  Good afternoon!  Patients Cardiac PET CT was denied by insurance.  Would a Myoview be an option?

## 2022-10-02 ENCOUNTER — Telehealth: Payer: Self-pay

## 2022-10-02 NOTE — Telephone Encounter (Signed)
Called patient to discuss Dr. Theodosia Blender recommendations, no answer, left message per [facility] for patient to call office.

## 2022-10-02 NOTE — Telephone Encounter (Signed)
----- Message from Sueanne Margarita, MD sent at 10/02/2022 12:52 PM EST ----- Regarding: RE: Cardiac PET CT was denied Please talk to patient about how she wants to proceed  may  need to do stress test in hospital ----- Message ----- From: Sharol Harness Sent: 10/02/2022  12:49 PM EST To: Sueanne Margarita, MD; Joni Reining, RN Subject: RE: Cardiac PET CT was denied                  Patient's insurance is no longer active ----- Message ----- From: Sueanne Margarita, MD Sent: 09/29/2022  12:54 PM EST To: Sharol Harness; Joni Reining, RN Subject: RE: Cardiac PET CT was denied                  Danae Chen please change Stress PET CT to a Stress myoview ----- Message ----- From: Sharol Harness Sent: 09/29/2022  12:17 PM EST To: Sueanne Margarita, MD Subject: RE: Cardiac PET CT was denied                  It would have be ordered for me to submit an auth ----- Message ----- From: Sueanne Margarita, MD Sent: 09/29/2022  11:55 AM EST To: Lorenza Evangelist, RN; Sharol Harness; # Subject: RE: Cardiac PET CT was denied                  Can you find out if they will pay for a Stress myoview ----- Message ----- From: Sharol Harness Sent: 09/29/2022  11:27 AM EST To: Sueanne Margarita, MD; Lorenza Evangelist, RN; # Subject: RE: Cardiac PET CT was denied                  Good morning,   Here are the denial details. The original auth and appeal were denied.  Based on [employer]iac Imaging Guidelines Section(s): Cardiac PET - Perfusion - Indications (CD 6.2), we cannot approve this request. Your healthcare provider told us that there is a concern related to your heart. The request cannot be approved because:  It must be needed because there is documentation that you have one of the following. -Severe obesity (such as a body mass index of 40 or higher). -Large breasts or implants. -Inability to exercise enough to reach a target heart rate due to a physical  problem. 78431 1 Myocardial imaging, positron emission tomography (PET), perfusion study (including ventricular wall motion[s] and/or ejection fraction[s], when performed); multiple studies at rest and stress (exercise or pharmacologic)   Denied Following consideration of the original adverse decision, the initial decision is upheld with the same rationaleBased on [employer] Cardiac Imaging Guidelines Section(s): Cardiac PET - Perfusion - Indications (CD 6.2), we cannot approve this request. Your healthcare provider told us that there is a concern related to the blood vessels that carry blood to your heart. The request cannot be approved because:  It must be needed because there is documentation that you have one of the following. -Severe obesity (such as a body mass index of 40 or higher). -Large breasts or implants. -Inability to exercise enough to reach a target heart rate due to a physical problem.   ----- Message ----- From: Lorenza Evangelist, RN Sent: 09/29/2022  11:14 AM EST To: Sueanne Margarita, MD; Sharol Harness; # Subject: RE: Cardiac PET CT was denied  Hey I'm going to add anisah who precerts for NM PET CT CARDIAC .   Anisah, can you follow up to see why this Josem Kaufmann was denied?   Thank you, Marchia Bond RN Navigator Cardiac Imaging [facility] [phone] Office  [phone] Cell   ----- Message ----- From: Joni Reining, RN Sent: 09/29/2022  10:40 AM EST To: Lorenza Evangelist, RN Subject: Cardiac PET CT was denied                      Kathe Mariner,  Would you know what options we have with this or who to call? Thanks,  Danae Chen ----- Message ----- From: Sueanne Margarita, MD Sent: 09/28/2022  10:53 AM EST To: Glynis Smiles; Joni Reining, RN  Danae Chen can you please find out who precerts the coronary CTA and ask them to call insurance as this was ordered for chest pain so not sure why insurance will not  cover  Traci ----- Message ----- From: Frederic Jericho Sent: 09/28/2022   9:32 AM EST To: Sueanne Margarita, MD  These are obtained by the referral and auth team for [facility] office.  ----- Message ----- From: Sueanne Margarita, MD Sent: 09/26/2022   9:11 AM EST To: Haig Prophet  That doe not make sense - she is having chest pain - can you please check with insurance as they should pay for chest pain - make sure the ICD code subitted was correct ----- Message ----- From: Frederic Jericho Sent: 09/25/2022   1:14 PM EST To: Sueanne Margarita, MD; Sunday Shams  Good afternoon!  Patients Cardiac PET CT was denied by insurance.  Would a Myoview be an option?

## 2022-10-03 ENCOUNTER — Other Ambulatory Visit: Payer: Self-pay | Admitting: Medical

## 2022-10-13 ENCOUNTER — Telehealth: Payer: Self-pay

## 2022-10-13 NOTE — Telephone Encounter (Signed)
----- Message from Sueanne Margarita, MD sent at 10/02/2022 12:52 PM EST ----- Regarding: RE: Cardiac PET CT was denied Please talk to patient about how she wants to proceed  may  need to do stress test in hospital ----- Message ----- From: Sharol Harness Sent: 10/02/2022  12:49 PM EST To: Sueanne Margarita, MD; Joni Reining, RN Subject: RE: Cardiac PET CT was denied                  Patient's insurance is no longer active ----- Message ----- From: Sueanne Margarita, MD Sent: 09/29/2022  12:54 PM EST To: Sharol Harness; Joni Reining, RN Subject: RE: Cardiac PET CT was denied                  Danae Chen please change Stress PET CT to a Stress myoview ----- Message ----- From: Sharol Harness Sent: 09/29/2022  12:17 PM EST To: Sueanne Margarita, MD Subject: RE: Cardiac PET CT was denied                  It would have be ordered for me to submit an auth ----- Message ----- From: Sueanne Margarita, MD Sent: 09/29/2022  11:55 AM EST To: Lorenza Evangelist, RN; Sharol Harness; # Subject: RE: Cardiac PET CT was denied                  Can you find out if they will pay for a Stress myoview ----- Message ----- From: Sharol Harness Sent: 09/29/2022  11:27 AM EST To: Sueanne Margarita, MD; Lorenza Evangelist, RN; # Subject: RE: Cardiac PET CT was denied                  Good morning,   Here are the denial details. The original auth and appeal were denied.  Based on [employer] Imaging Guidelines Section(s): Cardiac PET - Perfusion - Indications (CD 6.2), we cannot approve this request. Your healthcare provider told us that there is a concern related to your heart. The request cannot be approved because:  It must be needed because there is documentation that you have one of the following. -Severe obesity (such as a body mass index of 40 or higher). -Large breasts or implants. -Inability to exercise enough to reach a target heart rate due to a physical  problem. 78431 1 Myocardial imaging, positron emission tomography (PET), perfusion study (including ventricular wall motion[s] and/or ejection fraction[s], when performed); multiple studies at rest and stress (exercise or pharmacologic)   Denied Following consideration of the original adverse decision, the initial decision is upheld with the same rationaleBased on [employer] Cardiac Imaging Guidelines Section(s): Cardiac PET - Perfusion - Indications (CD 6.2), we cannot approve this request. Your healthcare provider told us that there is a concern related to the blood vessels that carry blood to your heart. The request cannot be approved because:  It must be needed because there is documentation that you have one of the following. -Severe obesity (such as a body mass index of 40 or higher). -Large breasts or implants. -Inability to exercise enough to reach a target heart rate due to a physical problem.   ----- Message ----- From: Lorenza Evangelist, RN Sent: 09/29/2022  11:14 AM EST To: Sueanne Margarita, MD; Sharol Harness; # Subject: RE: Cardiac PET CT was denied  Hey I'm going to add anisah who precerts for NM PET CT CARDIAC .   Anisah, can you follow up to see why this Josem Kaufmann was denied?   Thank you, Marchia Bond RN Navigator Cardiac Imaging [facility] [phone] Office  [phone] Cell   ----- Message ----- From: Joni Reining, RN Sent: 09/29/2022  10:40 AM EST To: Lorenza Evangelist, RN Subject: Cardiac PET CT was denied                      Kathe Mariner,  Would you know what options we have with this or who to call? Thanks,  Danae Chen ----- Message ----- From: Sueanne Margarita, MD Sent: 09/28/2022  10:53 AM EST To: Glynis Smiles; Joni Reining, RN  Danae Chen can you please find out who precerts the coronary CTA and ask them to call insurance as this was ordered for chest pain so not sure why insurance will not  cover  Traci ----- Message ----- From: Frederic Jericho Sent: 09/28/2022   9:32 AM EST To: Sueanne Margarita, MD  These are obtained by the referral and auth team for [facility] office.  ----- Message ----- From: Sueanne Margarita, MD Sent: 09/26/2022   9:11 AM EST To: Haig Prophet  That doe not make sense - she is having chest pain - can you please check with insurance as they should pay for chest pain - make sure the ICD code subitted was correct ----- Message ----- From: Frederic Jericho Sent: 09/25/2022   1:14 PM EST To: Sueanne Margarita, MD; Sunday Shams  Good afternoon!  Patients Cardiac PET CT was denied by insurance.  Would a Myoview be an option?

## 2022-10-13 NOTE — Telephone Encounter (Signed)
Called patient to review insurance denials and expired insurance coverage. Patient states she does have insurance coverage with [employer]. Asked patient to bring in her current insurance cards so we could update her chart and attempt to get insurance approval for testing to evaluate her symptoms of chest pain. Patient states she will bring insurance cards in this week. Explained to patient that we could then submit for a type of testing that her insurance would approve.

## 2022-10-22 ENCOUNTER — Ambulatory Visit: Payer: BLUE CROSS/BLUE SHIELD | Admitting: Podiatry

## 2022-10-22 ENCOUNTER — Ambulatory Visit (INDEPENDENT_AMBULATORY_CARE_PROVIDER_SITE_OTHER): Payer: BLUE CROSS/BLUE SHIELD

## 2022-10-22 ENCOUNTER — Encounter: Payer: Self-pay | Admitting: Podiatry

## 2022-10-22 DIAGNOSIS — M7671 Peroneal tendinitis, right leg: Secondary | ICD-10-CM

## 2022-10-22 DIAGNOSIS — M779 Enthesopathy, unspecified: Secondary | ICD-10-CM

## 2022-10-22 MED ORDER — TRIAMCINOLONE ACETONIDE 10 MG/ML IJ SUSP
10.0000 mg | Freq: Once | INTRAMUSCULAR | Status: AC
  Administered 2022-10-22: 10 mg

## 2022-10-22 NOTE — Telephone Encounter (Signed)
Hello,   Yes, [employer] has been loaded into [employer]. The issue is that pt's health plan does not allow services to be rendered from out of network providers.

## 2022-10-22 NOTE — Telephone Encounter (Signed)
I sent you an email of the site selection available for pt's health plan.

## 2022-10-23 ENCOUNTER — Telehealth: Payer: Self-pay

## 2022-10-23 DIAGNOSIS — R079 Chest pain, unspecified: Secondary | ICD-10-CM

## 2022-10-23 NOTE — Telephone Encounter (Signed)
Called and spoke to patient, advised that because she had turned in her new insurance cards a stress test may be approved by her insurance. Advised patient she may get notifications about pending approval and that someone would call her to schedule once NM PET CT was approved. Patient verbalizes understanding and agrees to plan. Orders placed.

## 2022-10-23 NOTE — Progress Notes (Signed)
Subjective:   Patient ID: Teresa Mcmahon, female   DOB: 62 y.o.   MRN: [id number]   HPI Patient states overall she has been doing well but she is getting quite a bit of discomfort in the lateral side of the right foot base of fifth metatarsal with inflammation that is just reoccurred   ROS      Objective:  Physical Exam  Peroneal tendinitis right base of fifth metatarsal with inflammation possibility for bony injury     Assessment:  Pain is centered at the insertion of the tendon base of fifth metatarsal mild edema     Plan:  Reviewed condition and went ahead and reviewed x-ray then did sterile prep injected the fifth metatarsal base tendon insertion 3 mg dexamethasone Kenalog 5 mg Xylocaine advised on ice therapy reappoint to recheck  X-rays indicate no signs of there is a fracture in the area appears to be a soft tissue injury no change compared to previous x-rays

## 2022-10-23 NOTE — Telephone Encounter (Signed)
-----   Message from Traci R Turner, MD sent at 10/23/2022  6:32 AM EDT ----- Regarding: RE: Coverage for stress test Sara since patient now has [employer]BS can we try to submit to them a NM PET CT stress to see if they will pay - dx is chest pain and coronary artery calcifications on Coronary Ca score test ----- Message ----- From: Nohemi Nicklaus L, RN Sent: 10/22/2022   3:27 PM EDT To: Traci R Turner, MD; Anisah M Abdul-Razzaaq Subject: Coverage for stress test                       Oh no, so they are not covering a stress test at [facility]? Is there another provider they will cover? ----- Message ----- From: Abdul-Razzaaq, Anisah M Sent: 10/22/2022  12:18 PM EDT To: Lilyrose Tanney L Jasilyn Holderman, RN     ----- Message ----- From: Demir Titsworth L, RN Sent: 10/22/2022  11:31 AM EDT To: Anisah M Abdul-Razzaaq  Hi Anisah,  I just wanted to check and see if new insurance cards were turned in for this patient. If not, I can call her again. Calyse Murcia, RN   

## 2022-10-27 ENCOUNTER — Telehealth: Payer: Self-pay

## 2022-10-27 NOTE — Telephone Encounter (Signed)
-----   Message from Sueanne Margarita, MD sent at 10/23/2022  6:32 AM EDT ----- Regarding: RE: Coverage for stress test Clarise Cruz since patient now has BCBS can we try to submit to them a NM PET CT stress to see if they will pay - dx is chest pain and coronary artery calcifications on Coronary Ca score test ----- Message ----- From: Joni Reining, RN Sent: 10/22/2022   3:27 PM EDT To: Sueanne Margarita, MD; Sharol Harness Subject: Coverage for stress test                       Oh no, so they are not covering a stress test at [facility]? Is there another provider they will cover? ----- Message ----- From: Sharol Harness Sent: 10/22/2022  12:18 PM EDT To: Joni Reining, RN     ----- Message ----- From: Joni Reining, RN Sent: 10/22/2022  11:31 AM EDT To: Greggory Brandy Abdul-Razzaaq  Hi Anisah,  I just wanted to check and see if new insurance cards were turned in for this patient. If not, I can call her again. Danae Chen, RN

## 2022-10-30 LAB — HM MAMMOGRAPHY

## 2022-11-11 NOTE — Telephone Encounter (Signed)
Pt was notified of results

## 2022-11-11 NOTE — Telephone Encounter (Signed)
Left message for pt to call me back 

## 2022-11-11 NOTE — Telephone Encounter (Signed)
I am happy to report that her mammogram was normal, no worrisome findings.

## 2022-11-12 ENCOUNTER — Other Ambulatory Visit: Payer: Self-pay | Admitting: Medical

## 2022-11-12 NOTE — Telephone Encounter (Signed)
Is this okay to refill? 

## 2022-11-13 ENCOUNTER — Other Ambulatory Visit: Payer: Self-pay | Admitting: Orthopaedic Surgery

## 2022-11-13 ENCOUNTER — Ambulatory Visit: Payer: BLUE CROSS/BLUE SHIELD | Admitting: Obstetrics

## 2022-11-13 ENCOUNTER — Encounter: Payer: Self-pay | Admitting: Internal Medicine

## 2022-11-23 ENCOUNTER — Telehealth (HOSPITAL_COMMUNITY): Payer: Self-pay | Admitting: *Deleted

## 2022-11-23 NOTE — Telephone Encounter (Signed)
Reaching out to patient to offer assistance regarding upcoming cardiac imaging study; pt verbalizes understanding of appt date/time, parking situation and where to check in, pre-test NPO status  and verified current allergies; name and call back number provided for further questions should they arise  Shaleena Crusoe RN Navigator Cardiac Imaging [facility] [phone] office [phone] cell  Patient aware to avoid caffeine 12 hours prior to her cardiac PET scan. 

## 2022-11-24 ENCOUNTER — Telehealth (HOSPITAL_COMMUNITY): Payer: Self-pay | Admitting: *Deleted

## 2022-11-24 NOTE — Telephone Encounter (Signed)
Calling patient to cancel tomorrow's Cardiac PET appointment due to insurance authorization issues. Left name and call back number.  Larey Brick RN Navigator Cardiac Imaging [facility] [phone] Office [phone] Cell

## 2022-11-25 ENCOUNTER — Ambulatory Visit (HOSPITAL_COMMUNITY): Admission: RE | Admit: 2022-11-25 | Payer: BLUE CROSS/BLUE SHIELD | Source: Ambulatory Visit

## 2022-11-25 ENCOUNTER — Other Ambulatory Visit: Payer: Self-pay | Admitting: Orthopaedic Surgery

## 2022-12-08 ENCOUNTER — Other Ambulatory Visit: Payer: Self-pay | Admitting: Medical

## 2023-04-06 ENCOUNTER — Telehealth: Payer: Self-pay | Admitting: Medical

## 2023-04-06 NOTE — Telephone Encounter (Signed)
Fax refill request  Vit D2 1.25 mg #4  Last filled 02/12/23

## 2023-04-07 NOTE — Telephone Encounter (Signed)
Pt will need an appointment to get a refill on this. We have to check labs. She is due for CPE in October. Can you please schedule

## 2023-04-08 NOTE — Telephone Encounter (Signed)
Left message for pt to call and schedule Physical per Martie Lee

## 2023-06-24 ENCOUNTER — Other Ambulatory Visit: Payer: Self-pay | Admitting: Medical

## 2023-06-24 NOTE — Telephone Encounter (Signed)
Pt has an appt with [facility] in November with new pcp

## 2023-08-09 ENCOUNTER — Other Ambulatory Visit: Payer: Self-pay | Admitting: Medical

## 2023-08-09 NOTE — Telephone Encounter (Signed)
Pt sees PCP in [facility]

## 2023-08-24 ENCOUNTER — Other Ambulatory Visit: Payer: Self-pay | Admitting: Orthopaedic Surgery

## 2023-09-10 ENCOUNTER — Ambulatory Visit (INDEPENDENT_AMBULATORY_CARE_PROVIDER_SITE_OTHER): Payer: No Typology Code available for payment source | Admitting: Podiatry

## 2023-09-10 ENCOUNTER — Encounter: Payer: Self-pay | Admitting: Podiatry

## 2023-09-10 DIAGNOSIS — M7671 Peroneal tendinitis, right leg: Secondary | ICD-10-CM

## 2023-09-10 MED ORDER — TRIAMCINOLONE ACETONIDE 10 MG/ML IJ SUSP
10.0000 mg | Freq: Once | INTRAMUSCULAR | Status: AC
  Administered 2023-09-10: 10 mg via INTRA_ARTICULAR

## 2023-09-10 NOTE — Progress Notes (Signed)
Subjective:   Patient ID: Teresa Mcmahon, female   DOB: 63 y.o.   MRN: [id number]   HPI Patient presents with intense discomfort around the base of the fifth metatarsal right inflammation fluid around the insertion   ROS      Objective:  Physical Exam  Neurovascular status intact inflammation pain base of fifth metatarsal right with peroneal involvement at the insertion     Assessment:  Peroneal tendinitis right inflammation fluid noted     Plan:  Reviewed condition sterile prep injected the sheath of the tendon 3 mg Dexasone Kenalog 5 mg Xylocaine advised on reduced activity and discussed risk prior to initiation of injection.  Reappoint as symptoms indicate

## 2024-02-25 ENCOUNTER — Encounter: Payer: Self-pay | Admitting: Podiatry

## 2024-02-25 ENCOUNTER — Ambulatory Visit (INDEPENDENT_AMBULATORY_CARE_PROVIDER_SITE_OTHER): Admitting: Podiatry

## 2024-02-25 DIAGNOSIS — M779 Enthesopathy, unspecified: Secondary | ICD-10-CM

## 2024-02-25 DIAGNOSIS — M722 Plantar fascial fibromatosis: Secondary | ICD-10-CM

## 2024-02-25 MED ORDER — TRIAMCINOLONE ACETONIDE 10 MG/ML IJ SUSP
10.0000 mg | Freq: Once | INTRAMUSCULAR | Status: AC
  Administered 2024-02-25: 10 mg via INTRA_ARTICULAR

## 2024-02-28 NOTE — Progress Notes (Signed)
 Subjective:   Patient ID: Teresa Mcmahon, female   DOB: 63 y.o.   MRN: [id number]   HPI Patient presents with a lot of pain in the inside of the right ankle and in the left heel.  States both of them been bothering her for several months with the right being worse recently and she does not remember injury   ROS      Objective:  Physical Exam  Neurovascular status intact with patient found to have inflammation pain around the posterior tibial tendon right with irritation of the tissue no indication collapse medial longitudinal arch with exquisite discomfort in the left plantar fascia at the insertional point of the tendon into the calcaneus     Assessment:  What appears to be acute posterior tibial tendinitis right with acute plantar fasciitis left which may be compensatory     Plan:  H&P reviewed and at this point I am going to do a careful sheath injection right I did explain risk of rupture and that eventually she may need an MRI for this and she wants to proceed with the procedure.  I did sterile prep I injected the sheath of the tendon 3 mg dexamethasone Kenalog  5 mg Xylocaine  and I then for the left went ahead did sterile prep and injected the plantar fascia 3 mg Kenalog  5 mg Xylocaine  and applied sterile dressing.  Patient will wear support shoes and not go barefoot and recheck  X-rays do indicate moderate collapse medial longitudinal arch right but not severe and indicate spur formation left

## 2024-03-11 ENCOUNTER — Encounter: Payer: Self-pay | Admitting: Gastroenterology

## 2024-04-28 ENCOUNTER — Other Ambulatory Visit (INDEPENDENT_AMBULATORY_CARE_PROVIDER_SITE_OTHER)

## 2024-04-28 ENCOUNTER — Encounter: Payer: Self-pay | Admitting: Gastroenterology

## 2024-04-28 ENCOUNTER — Ambulatory Visit: Payer: Self-pay | Admitting: Gastroenterology

## 2024-04-28 ENCOUNTER — Ambulatory Visit: Admitting: Gastroenterology

## 2024-04-28 DIAGNOSIS — K625 Hemorrhage of anus and rectum: Secondary | ICD-10-CM

## 2024-04-28 DIAGNOSIS — Z860101 Personal history of adenomatous and serrated colon polyps: Secondary | ICD-10-CM | POA: Diagnosis not present

## 2024-04-28 DIAGNOSIS — R194 Change in bowel habit: Secondary | ICD-10-CM | POA: Diagnosis not present

## 2024-04-28 DIAGNOSIS — D509 Iron deficiency anemia, unspecified: Secondary | ICD-10-CM | POA: Diagnosis not present

## 2024-04-28 DIAGNOSIS — K219 Gastro-esophageal reflux disease without esophagitis: Secondary | ICD-10-CM

## 2024-04-28 DIAGNOSIS — R1319 Other dysphagia: Secondary | ICD-10-CM

## 2024-04-28 LAB — CBC WITH DIFFERENTIAL/PLATELET
Basophils Absolute: 0 K/uL (ref 0.0–0.1)
Basophils Relative: 0.5 % (ref 0.0–3.0)
Eosinophils Absolute: 0.1 K/uL (ref 0.0–0.7)
Eosinophils Relative: 1.7 % (ref 0.0–5.0)
HCT: 33.9 % — ABNORMAL LOW (ref 36.0–46.0)
Hemoglobin: 10.6 g/dL — ABNORMAL LOW (ref 12.0–15.0)
Lymphocytes Relative: 29.4 % (ref 12.0–46.0)
Lymphs Abs: 1.3 K/uL (ref 0.7–4.0)
MCHC: 31.3 g/dL (ref 30.0–36.0)
MCV: 71.9 fl — ABNORMAL LOW (ref 78.0–100.0)
Monocytes Absolute: 0.4 K/uL (ref 0.1–1.0)
Monocytes Relative: 8.5 % (ref 3.0–12.0)
Neutro Abs: 2.6 K/uL (ref 1.4–7.7)
Neutrophils Relative %: 59.9 % (ref 43.0–77.0)
Platelets: 197 K/uL (ref 150.0–400.0)
RBC: 4.72 Mil/uL (ref 3.87–5.11)
RDW: 15.8 % — ABNORMAL HIGH (ref 11.5–15.5)
WBC: 4.4 K/uL (ref 4.0–10.5)

## 2024-04-28 MED ORDER — HYDROCORTISONE ACETATE 25 MG RE SUPP: 25.0000 mg | Freq: Every day | RECTAL | 1 refills | Status: AC

## 2024-04-28 MED ORDER — NA SULFATE-K SULFATE-MG SULF 17.5-3.13-1.6 GM/177ML PO SOLN: 1.0000 | ORAL | 0 refills | Status: DC

## 2024-04-28 NOTE — Patient Instructions (Addendum)
 _______________________________________________________  If your blood pressure at your visit was 140/90 or greater, please contact your primary care physician to follow up on this.  _______________________________________________________  If you are age 65 or older, your body mass index should be between 23-30. Your Body mass index is 30.48 kg/m. If this is out of the aforementioned range listed, please consider follow up with your Primary Care Provider.  If you are age 24 or younger, your body mass index should be between 19-25. Your Body mass index is 30.48 kg/m. If this is out of the aformentioned range listed, please consider follow up with your Primary Care Provider.   ________________________________________________________  The [facility] providers would like to encourage you to use MYCHART to communicate with providers for non-urgent requests or questions.  Due to long hold times on the telephone, sending your provider a message by [facility] may be a faster and more efficient way to get a response.  Please allow 48 business hours for a response.  Please remember that this is for non-urgent requests.  _______________________________________________________  [facility] is using a team-based approach to care.  Your team is made up of your doctor and two to three APPS. Our APPS (Nurse Practitioners and Physician Assistants) work with your physician to ensure care continuity for you. They are fully qualified to address your health concerns and develop a treatment plan. They communicate directly with your gastroenterologist to care for you. Seeing the Advanced Practice Practitioners on your physician's team can help you by facilitating care more promptly, often allowing for earlier appointments, access to diagnostic testing, procedures, and other specialty referrals.   Your provider has requested that you go to the basement level for lab work before leaving today. Press B on the  elevator. The lab is located at the first door on the left as you exit the elevator.  We have sent the following medications to your pharmacy for you to pick up at your convenience:  START: Anusol  suppositories use one at night for 3 to 5 days  Please purchase the following medications over the counter and take as directed:  START: Benefiber 1 tablespoon daily  You have been scheduled for an endoscopy and colonoscopy. Please follow the written instructions given to you at your visit today.  If you use inhalers (even only as needed), please bring them with you on the day of your procedure.  DO NOT TAKE 7 DAYS PRIOR TO TEST- Trulicity (dulaglutide) Ozempic, Wegovy (semaglutide) Mounjaro (tirzepatide) Bydureon Bcise (exanatide extended release)  DO NOT TAKE 1 DAY PRIOR TO YOUR TEST Rybelsus (semaglutide) Adlyxin (lixisenatide) Victoza (liraglutide) Byetta (exanatide) ___________________________________________________________________________  Due to recent changes in healthcare laws, you may see the results of your imaging and laboratory studies on MyChart before your provider has had a chance to review them.  We understand that in some cases there may be results that are confusing or concerning to you. Not all laboratory results come back in the same time frame and the provider may be waiting for multiple results in order to interpret others.  Please give us  48 hours in order for your provider to thoroughly review all the results before contacting the office for clarification of your results.   Thank you for entrusting me with your care and choosing [facility].  Camie Furbish, PA-C

## 2024-04-28 NOTE — Progress Notes (Signed)
 Teresa Mcmahon [id number] Feb 17, 1961   Chief Complaint: Rectal bleeding  Referring Provider: Jhon Elveria LABOR, MD Primary GI MD: Dr. Legrand  HPI: Teresa Mcmahon is a 63 y.o. female with past medical history of anemia, anxiety, arthritis, asthma, GERD who presents today for a complaint of rectal bleeding.    Diagnosed with a right-sided colitis at the time of ER visit 02/03/2022 after having somewhat progressive right lower quadrant abdominal pain eventually involving the entire right abdomen over about 2 to 3 weeks prior to that ER visit.  Interestingly no associated diarrhea, no melena or hematochezia.  No fever or chills, she did have significant abdominal cramping.  CT showed mild wall thickening and pericolonic edema at the cecum and including a moderate length of the ascending colon.   She was treated with a course of Cipro  and Flagyl  with improvement in symptoms, however after 2 weeks off of antibiotics she continued to have right lower quadrant pain/discomfort with some mild progression.  Underwent colonoscopy 03/20/2022 with finding of an 18 x 4 mm adenomatous polyp in the proximal ascending colon, and 4 diminutive adenomatous polyps in the ascending colon.  No colitis seen.  Recommended recall 2 years.  Labs 07/28/2023: Hemoglobin 10.9, hematocrit 35, MCV 72.4, normal CMP, negative hep C, negative HIV, hemoglobin A1c 5.6, normal lipid panel, normal vitamin D .  She is on iron supplementation.  Seen for consult in December by cardiology for chest pain.  Had a stress echo 09/03/2023.  Rest echo with normal LV function and LVEF of 65 to 70%.  Stress echo with normal LV function and LV EF greater than 70%.  Negative exercise echo for inducible ischemia at target heart rate.   Patient states that over the last 4 to 5 months she has been seeing bright red blood on the toilet paper intermittently.  Denies any associated abdominal or rectal pain.  Not feeling any  hemorrhoids.  No blood in the toilet or in the stool itself.  Recent rectal bleeding.  Typically has a bowel movement twice a day, though in the last week has been having less frequent bowel movements, about once a day.  She denies any hard stools or straining.  Denies diarrhea, melena.  No fever, chills, nausea, vomiting.  States her GERD symptoms seem to be well-controlled on Nexium.  No prior EGD.  She does endorse occasional trouble swallowing which she attributes to not chewing her food thoroughly sometimes.  Has felt food gets stuck on occasion.  Feels stuck in her chest about once or twice a week.  Occasionally has some trouble with liquids where she swallows and this triggers her cough reflex.  After eating sometimes feels tight in her chest.  Denies any chest pain with exertion and denies any shortness of breath.  Reports she has had iron deficiency anemia her whole life.  Previous GI Procedures/Imaging   Colonoscopy 03/20/2022 - The examined portion of the ileum was normal.  - Four diminutive polyps in the ascending colon, removed with a cold snare. Resected and retrieved.  - One 18 x 4 mm polyp in the proximal ascending colon, removed piecemeal using a cold snare. Resected and retrieved.  - Medium- sized lipoma in the proximal descending colon.  - The examination was otherwise normal on direct and retroflexion views. - Recall 2 years  No colitis seen. ? acute infectious issue now resolved?  Path: 1. Surgical [P], colon, ascending x 4, polyp (4) TUBULAR ADENOMAS NEGATIVE FOR HIGH-GRADE DYSPLASIA AND CARCINOMA 2. Surgical [  P], colon, ascending x 1 (removed piecemeal), polyp (1) TUBULAR ADENOMA NEGATIVE FOR HIGH-GRADE DYSPLASIA AND CARCINOMA  Past Medical History:  Diagnosis Date   Agatston coronary artery calcium  score between 200 and 399    Score 205 on 06/2022   Allergy    Anemia    Anxiety    Arthritis    Asthma    Carpal tunnel syndrome    GASTROESOPHAGEAL REFLUX  DISEASE 05/11/2007   Qualifier: Diagnosis of  By: Loretha MD, [place]      Past Surgical History:  Procedure Laterality Date   ANTERIOR CRUCIATE LIGAMENT REPAIR Left    2018   BACK SURGERY     COLONOSCOPY  07/20/2011   Procedure: COLONOSCOPY;  Surgeon: Oliva FORBES Boots, MD;  Location: [facility] ENDOSCOPY;  Service: Endoscopy;  Laterality: N/A;   COLONOSCOPY  03/2022   mult polyps, lipoma.  Dr. Victory Brand   ENDOMETRIAL ABLATION  2006   NECK SURGERY     x3    Current Outpatient Medications  Medication Sig Dispense Refill   albuterol  (VENTOLIN  HFA) 108 (90 Base) MCG/ACT inhaler ProAir  HFA 90 mcg/actuation aerosol inhaler     amLODipine  (NORVASC ) 10 MG tablet Take 1 tablet (10 mg total) by mouth daily. 90 tablet 1   aspirin  EC 81 MG tablet Take 1 tablet (81 mg total) by mouth daily. Swallow whole. 90 tablet 3   busPIRone  (BUSPAR ) 5 MG tablet Take 1 tablet (5 mg total) by mouth at bedtime as needed. 90 tablet 0   celecoxib  (CELEBREX ) 200 MG capsule TAKE ONE CAPSULE BY MOUTH TWICE DAILY between meals 60 capsule 1   cetirizine  (ZYRTEC  ALLERGY) 10 MG tablet Take 1 tablet (10 mg total) by mouth at bedtime. 30 tablet 0   ergocalciferol  (VITAMIN D2) 1.25 MG (50000 UT) capsule ergocalciferol  (vitamin D2) 1,250 mcg (50,000 unit) capsule  TAKE ONE CAPSULE BY MOUTH Once weekly     esomeprazole (NEXIUM) 40 MG capsule esomeprazole magnesium 40 mg capsule,delayed release  TAKE ONE CAPSULE BY MOUTH EVERY DAY     ferrous gluconate  (FERGON) 324 MG tablet Take 1 tablet (324 mg total) by mouth daily with breakfast. 90 tablet 1   fluticasone  (FLONASE ) 50 MCG/ACT nasal spray Place 2 sprays into both nostrils daily. 18 mL 0   hydrocortisone  (ANUSOL -HC) 25 MG suppository Place 1 suppository (25 mg total) rectally at bedtime. Use for 3 to 5 days 12 suppository 1   Ibuprofen  (MOTRIN  PO) Take by mouth.     ipratropium (ATROVENT ) 0.06 % nasal spray Place 2 sprays into both nostrils 4 (four) times daily. As needed for  nasal congestion, runny nose 15 mL 0   methocarbamol  (ROBAXIN ) 500 MG tablet TAKE ONE TABLET BY MOUTH EVERY SIX HOURS AS NEEDED 40 tablet 1   methylPREDNISolone  (MEDROL ) 4 MG tablet Medrol  dose pack. Take as instructed 21 tablet 0   Na Sulfate-K Sulfate-Mg Sulfate concentrate (SUPREP BOWEL PREP KIT) 17.5-3.13-1.6 GM/177ML SOLN Take 1 kit (354 mLs total) by mouth as directed. 324 mL 0   rosuvastatin  (CRESTOR ) 10 MG tablet Take 1 tablet (10 mg total) by mouth daily. 90 tablet 3   sertraline (ZOLOFT) 100 MG tablet Take 100 mg by mouth daily.     No current facility-administered medications for this visit.    Allergies as of 04/28/2024 - Review Complete 04/28/2024  Allergen Reaction Noted   Tramadol hcl Itching and Nausea Only    Trazodone and nefazodone  06/05/2022   Latex Itching and Rash 10/14/2011  Family History  Problem Relation Age of Onset   ALS Mother    Hypertension Mother    Chronic Renal Failure Father    Hypertension Father    Hypertension Sister    Lupus Sister    Diabetes Sister    Hypertension Sister    Diabetes Sister    Hypertension Sister    Heart disease Sister    Lupus Sister    Diabetes Brother    Cancer Brother        lung   Lupus Brother    Lupus Brother    Colon cancer Other    Anesthesia problems Neg Hx    Hypotension Neg Hx    Malignant hyperthermia Neg Hx    Pseudochol deficiency Neg Hx    Esophageal cancer Neg Hx    Rectal cancer Neg Hx    Stomach cancer Neg Hx     Social History   Tobacco Use   Smoking status: Never   Smokeless tobacco: Never  Vaping Use   Vaping status: Never Used  Substance Use Topics   Alcohol use: Yes    Comment: once a month a beer or 2   Drug use: No     Review of Systems:    Constitutional: No weight loss, fever, chills Cardiovascular: No chest pain Respiratory: No SOB  Gastrointestinal: See HPI and otherwise negative Genitourinary: No dysuria or change in urinary frequency Neurological: No headache,  dizziness or syncope Musculoskeletal: No new muscle or joint pain Hematologic: No bleeding or bruising    Physical Exam:  Vital signs: BP 124/64   Pulse 69   Ht 5' 5.5 (1.664 m)   Wt 186 lb (84.4 kg)   BMI 30.48 kg/m   Wt Readings from Last 3 Encounters:  04/28/24 186 lb (84.4 kg)  06/19/22 183 lb 6.4 oz (83.2 kg)  06/05/22 182 lb 6.4 oz (82.7 kg)     Constitutional: Pleasant, overweight female in NAD alert and cooperative Head:  Normocephalic and atraumatic.  Eyes: No scleral icterus.  Respiratory: Respirations even and unlabored. Lungs clear to auscultation bilaterally.  No wheezes, crackles, or rhonchi.  Cardiovascular:  Regular rate and rhythm. No murmurs. No peripheral edema. Gastrointestinal:  Soft, nondistended, nontender. No rebound or guarding. Normal bowel sounds. No appreciable masses or hepatomegaly. Rectal:  Offered, patient prefers to wait for colonoscopy  Neurologic:  Alert and oriented x4;  grossly normal neurologically.  Skin:   Dry and intact without significant lesions or rashes. Psychiatric: Oriented to person, place and time. Demonstrates good judgement and reason without abnormal affect or behaviors.   RELEVANT LABS AND IMAGING: CBC    Component Value Date/Time   WBC 4.4 04/28/2024 1137   RBC 4.72 04/28/2024 1137   HGB 10.6 (L) 04/28/2024 1137   HGB 10.6 (L) 06/05/2022 0900   HCT 33.9 (L) 04/28/2024 1137   HCT 34.9 06/05/2022 0900   PLT 197.0 04/28/2024 1137   PLT 235 06/05/2022 0900   MCV 71.9 (L) 04/28/2024 1137   MCV 73 (L) 06/05/2022 0900   MCH 22.2 (L) 06/05/2022 0900   MCH 22.8 (L) 02/03/2022 1619   MCHC 31.3 04/28/2024 1137   RDW 15.8 (H) 04/28/2024 1137   RDW 15.4 06/05/2022 0900   LYMPHSABS 1.3 04/28/2024 1137   LYMPHSABS 1.8 06/05/2022 0900   MONOABS 0.4 04/28/2024 1137   EOSABS 0.1 04/28/2024 1137   EOSABS 0.1 06/05/2022 0900   BASOSABS 0.0 04/28/2024 1137   BASOSABS 0.0 06/05/2022 0900    CMP  Component Value  Date/Time   NA 137 02/03/2022 1619   K 4.1 02/03/2022 1619   CL 106 02/03/2022 1619   CO2 23 02/03/2022 1619   GLUCOSE 99 02/03/2022 1619   BUN 13 02/03/2022 1619   CREATININE 0.97 02/03/2022 1619   CALCIUM  9.8 02/03/2022 1619   PROT 7.8 02/03/2022 1619   ALBUMIN 4.1 02/03/2022 1619   AST 30 02/03/2022 1619   ALT 23 07/31/2022 0737   ALKPHOS 70 02/03/2022 1619   BILITOT 0.4 02/03/2022 1619   GFRNONAA >60 02/03/2022 1619   Had a stress echo 09/03/2023.  Rest echo with normal LV function and LVEF of 65 to 70%.  Stress echo with normal LV function and LV EF greater than 70%.  Negative exercise echo for inducible ischemia at target heart rate.  Assessment/Plan:   History of adenomatous colon polyps Change in bowel habits Rectal bleeding Patient seen today to discuss rectal bleeding ahead of repeat colonoscopy.  On colonoscopy 03/20/2022 found to have an 18 x 4 mm adenomatous polyp in the proximal ascending colon as well as 4 diminutive adenomatous polyps in the ascending colon.  2-year recall was recommended and she is currently due.  Over the last 4 to 5 months she has been seeing bright red blood on the toilet paper intermittently.  Has not had any bleeding recently.  Not seeing with every bowel movement, not seeing any blood in the toilet or in the stool itself. Typically has bowel movement twice a day, though in the last week has had a bowel movement once a day.  Denies any hard stools, straining, diarrhea, melena.  Not having any abdominal or rectal pain.  - Schedule colonoscopy. I thoroughly discussed the procedure with the patient to include nature of the procedure, alternatives, benefits, and risks (including but not limited to bleeding, infection, perforation, anesthesia/cardiac/pulmonary complications). Patient verbalized understanding and gave verbal consent to proceed with procedure.  - Recommend adding a fiber supplement such as Benefiber or Metamucil - Will send Anusol   suppositories for patient to use if recurrence of rectal bleeding, 1 nightly for 3 to 5 days  GERD Esophageal dysphagia Iron deficiency anemia Patient reports longstanding history of iron deficiency anemia.  Has history of GERD which is largely controlled on Nexium.  Does endorse some intermittent trouble swallowing and can feel food stuck in her chest about once or twice a week.  No prior EGD.  - Schedule EGD with possible dilation to be done with upcoming colonoscopy - Continue Nexium 40 mg daily   Camie Furbish, PA-C [facility] 04/28/2024, 8:29 PM  Patient Care Team: Jhon Elveria LABOR, MD as PCP - General (Family Medicine) Shlomo Wilbert SAUNDERS, MD as PCP - Cardiology (Cardiology) Rudy Carlin LABOR, MD as Consulting Physician (Obstetrics and Gynecology) Magdalen Pasco RAMAN, DPM as Consulting Physician (Podiatry) Vernetta Lonni GRADE, MD as Consulting Physician (Orthopedic Surgery) Legrand Victory LITTIE MOULD, MD as Consulting Physician (Gastroenterology)

## 2024-05-01 NOTE — Progress Notes (Signed)
 ____________________________________________________________  Attending physician addendum:  Thank you for sending this case to me. I have reviewed the entire note and agree with the plan.   Victory Brand, MD  ____________________________________________________________

## 2024-05-22 ENCOUNTER — Encounter: Payer: Self-pay | Admitting: Podiatry

## 2024-05-22 ENCOUNTER — Ambulatory Visit (INDEPENDENT_AMBULATORY_CARE_PROVIDER_SITE_OTHER): Admitting: Podiatry

## 2024-05-22 DIAGNOSIS — M7671 Peroneal tendinitis, right leg: Secondary | ICD-10-CM

## 2024-05-22 MED ORDER — TRIAMCINOLONE ACETONIDE 10 MG/ML IJ SUSP
10.0000 mg | Freq: Once | INTRAMUSCULAR | Status: AC
  Administered 2024-05-22: 10 mg via INTRA_ARTICULAR

## 2024-05-22 NOTE — Progress Notes (Signed)
 Subjective:   Patient ID: Teresa Mcmahon, female   DOB: 63 y.o.   MRN: [id number]   HPI Patient presents stating the inside of the ankle is doing well but developing a lot of pain on the outside of the ankle   ROS      Objective:  Physical Exam  Neurovascular status intact with the inside of the ankle we treated last time doing very well but reoccurrence of peroneal tendinitis right that we last treated about 8 months ago     Assessment:  Peroneal tendinitis right with improvement of posterior tib     Plan:  H&P reviewed I went ahead today did sterile prep and injected the peroneal insertion right 3 mg dexamethasone Kenalog  5 mg Xylocaine  and applied sterile dressing

## 2024-05-30 ENCOUNTER — Other Ambulatory Visit: Payer: Self-pay

## 2024-05-30 DIAGNOSIS — Z860100 Personal history of colon polyps, unspecified: Secondary | ICD-10-CM

## 2024-05-30 MED ORDER — NA SULFATE-K SULFATE-MG SULF 17.5-3.13-1.6 GM/177ML PO SOLN: 1.0000 | Freq: Once | ORAL | 0 refills | Status: AC

## 2024-06-09 ENCOUNTER — Encounter: Payer: Self-pay | Admitting: Gastroenterology

## 2024-06-09 ENCOUNTER — Ambulatory Visit: Admitting: Gastroenterology

## 2024-06-09 VITALS — BP 179/99 | HR 66 | Temp 97.2°F | Resp 15 | Ht 65.0 in | Wt 186.0 lb

## 2024-06-09 DIAGNOSIS — K621 Rectal polyp: Secondary | ICD-10-CM

## 2024-06-09 DIAGNOSIS — Z860101 Personal history of adenomatous and serrated colon polyps: Secondary | ICD-10-CM

## 2024-06-09 DIAGNOSIS — K257 Chronic gastric ulcer without hemorrhage or perforation: Secondary | ICD-10-CM

## 2024-06-09 DIAGNOSIS — K2950 Unspecified chronic gastritis without bleeding: Secondary | ICD-10-CM

## 2024-06-09 DIAGNOSIS — K269 Duodenal ulcer, unspecified as acute or chronic, without hemorrhage or perforation: Secondary | ICD-10-CM

## 2024-06-09 DIAGNOSIS — D122 Benign neoplasm of ascending colon: Secondary | ICD-10-CM

## 2024-06-09 DIAGNOSIS — K3189 Other diseases of stomach and duodenum: Secondary | ICD-10-CM | POA: Diagnosis not present

## 2024-06-09 DIAGNOSIS — K625 Hemorrhage of anus and rectum: Secondary | ICD-10-CM | POA: Diagnosis not present

## 2024-06-09 DIAGNOSIS — Z1211 Encounter for screening for malignant neoplasm of colon: Secondary | ICD-10-CM

## 2024-06-09 DIAGNOSIS — Z860100 Personal history of colon polyps, unspecified: Secondary | ICD-10-CM

## 2024-06-09 DIAGNOSIS — K5730 Diverticulosis of large intestine without perforation or abscess without bleeding: Secondary | ICD-10-CM | POA: Diagnosis not present

## 2024-06-09 DIAGNOSIS — R1310 Dysphagia, unspecified: Secondary | ICD-10-CM

## 2024-06-09 DIAGNOSIS — D120 Benign neoplasm of cecum: Secondary | ICD-10-CM

## 2024-06-09 DIAGNOSIS — R1319 Other dysphagia: Secondary | ICD-10-CM

## 2024-06-09 MED ORDER — SODIUM CHLORIDE 0.9 % IV SOLN: 500.0000 mL | INTRAVENOUS | Status: DC

## 2024-06-09 NOTE — Patient Instructions (Signed)
 Resume previous diet  Continue present medications  Await pathology results Preparation H suppository: insert rectally as necessary  YOU HAD AN ENDOSCOPIC PROCEDURE TODAY AT THE [facility]:   Refer to the procedure report that was given to you for any specific questions about what was found during the examination.  If the procedure report does not answer your questions, please call your gastroenterologist to clarify.  If you requested that your care partner not be given the details of your procedure findings, then the procedure report has been included in a sealed envelope for you to review at your convenience later.  YOU SHOULD EXPECT: Some feelings of bloating in the abdomen. Passage of more gas than usual.  Walking can help get rid of the air that was put into your GI tract during the procedure and reduce the bloating. If you had a lower endoscopy (such as a colonoscopy or flexible sigmoidoscopy) you may notice spotting of blood in your stool or on the toilet paper. If you underwent a bowel prep for your procedure, you may not have a normal bowel movement for a few days.  Please Note:  You might notice some irritation and congestion in your nose or some drainage.  This is from the oxygen used during your procedure.  There is no need for concern and it should clear up in a day or so.  SYMPTOMS TO REPORT IMMEDIATELY: Following lower endoscopy (colonoscopy or flexible sigmoidoscopy):  Excessive amounts of blood in the stool  Significant tenderness or worsening of abdominal pains  Swelling of the abdomen that is new, acute  Fever of 100F or higher  Following upper endoscopy (EGD)  Vomiting of blood or coffee ground material  New chest pain or pain under the shoulder blades  Painful or persistently difficult swallowing  New shortness of breath  Black, tarry-looking stools For urgent or emergent issues, a gastroenterologist can be reached at any hour by calling [phone]. Do  not use MyChart messaging for urgent concerns.   DIET:  We do recommend a small meal at first, but then you may proceed to your regular diet.  Drink plenty of fluids but you should avoid alcoholic beverages for 24 hours.  ACTIVITY:  You should plan to take it easy for the rest of today and you should NOT DRIVE or use heavy machinery until tomorrow (because of the sedation medicines used during the test).    FOLLOW UP: Our staff will call the number listed on your records the next business day following your procedure.  We will call around 7:15- 8:00 am to check on you and address any questions or concerns that you may have regarding the information given to you following your procedure. If we do not reach you, we will leave a message.     If any biopsies were taken you will be contacted by phone or by letter within the next 1-3 weeks.  Please call us  at [phone] if you have not heard about the biopsies in 3 weeks.   SIGNATURES/CONFIDENTIALITY: You and/or your care partner have signed paperwork which will be entered into your electronic medical record.  These signatures attest to the fact that that the information above on your After Visit Summary has been reviewed and is understood.  Full responsibility of the confidentiality of this discharge information lies with you and/or your care-partner.

## 2024-06-09 NOTE — Op Note (Signed)
 [facility] Patient Name: Teresa Mcmahon Procedure Date: 06/09/2024 1:19 PM MRN: [id number] Endoscopist: Victory L. Legrand , MD, [id number] Age: 63 Referring MD:  Date of Birth: [birth year] Gender: Female Account #: [id number] Procedure:                Upper GI endoscopy Indications:              Pharyngeal phase dysphagia, Esophageal dysphagia                           intermittent. reports can occur swallowing saliva Medicines:                Monitored Anesthesia Care Procedure:                Pre-Anesthesia Assessment:                           - Prior to the procedure, a History and Physical                            was performed, and patient medications and                            allergies were reviewed. The patient's tolerance of                            previous anesthesia was also reviewed. The risks                            and benefits of the procedure and the sedation                            options and risks were discussed with the patient.                            All questions were answered, and informed consent                            was obtained. Prior Anticoagulants: The patient has                            taken no anticoagulant or antiplatelet agents. ASA                            Grade Assessment: II - A patient with mild systemic                            disease. After reviewing the risks and benefits,                            the patient was deemed in satisfactory condition to                            undergo the procedure.  After obtaining informed consent, the endoscope was                            passed under direct vision. Throughout the                            procedure, the patient's blood pressure, pulse, and                            oxygen saturations were monitored continuously. The                            GIF [id number] #[id number] was introduced through the                             mouth, and advanced to the second part of duodenum.                            The upper GI endoscopy was accomplished without                            difficulty. The patient tolerated the procedure                            well. Scope In: Scope Out: Findings:                 The larynx was normal.                           No endoscopic abnormality was evident in the                            esophagus to explain the patient's complaint of                            dysphagia. It was decided, however, to proceed with                            dilation. A guidewire was placed and the scope was                            withdrawn. Dilation was performed with a Savary                            dilator with mild resistance at 18 mm. The dilation                            site was examined and showed no change.                           There is no endoscopic evidence of Barrett's  esophagus, esophagitis, hiatal hernia,                            inflammation, mucosal abnormalities or stricture in                            the entire esophagus.                           A few erosions with no bleeding and no stigmata of                            recent bleeding were found in the gastric antrum.                            Several biopsies were obtained on the greater                            curvature of the gastric body, on the lesser                            curvature of the gastric body, on the greater                            curvature of the gastric antrum and on the lesser                            curvature of the gastric antrum with cold forceps                            for histology. (all gastric Bx in one path jar)                           The exam of the stomach was otherwise normal,                            including on retroflexion. Distended well with                            insufflation, no retained food or fluid.                            A few erosions were found in the duodenal bulb.                           The exam of the duodenum was otherwise normal. Complications:            No immediate complications. Estimated Blood Loss:     Estimated blood loss was minimal. Impression:               - Normal larynx.                           - No endoscopic esophageal abnormality to explain  patient's dysphagia. Esophagus dilated. Dilated.                           - Erosive gastropathy with no bleeding and no                            stigmata of recent bleeding.                           - Duodenal erosions.                           - Several biopsies were obtained on the greater                            curvature of the gastric body, on the lesser                            curvature of the gastric body, on the greater                            curvature of the gastric antrum and on the lesser                            curvature of the gastric antrum.                           If gastric Bx negative for H pylori, then the minor                            erosions seen are likely from aspirin  and not                            clinically significant. Recommendation:           - Patient has a contact number available for                            emergencies. The signs and symptoms of potential                            delayed complications were discussed with the                            patient. Return to normal activities tomorrow.                            Written discharge instructions were provided to the                            patient.                           - Resume previous diet.                           -  Continue present medications.                           - Await pathology results.                           - See the other procedure note for documentation of                            additional recommendations.                           - Try using  a chin-tuck maneuver when swallowing                            for what sounds like cricopharyngeal dysfunction. [name] L. Legrand, MD 06/09/2024 2:12:36 PM This report has been signed electronically.

## 2024-06-09 NOTE — Progress Notes (Signed)
 Called to room to assist during endoscopic procedure.  Patient ID and intended procedure confirmed with present staff. Received instructions for my participation in the procedure from the performing physician.

## 2024-06-09 NOTE — Progress Notes (Signed)
 1322 BP 195/105, Labetalol given IV, MD update, vss

## 2024-06-09 NOTE — Progress Notes (Signed)
 History and Physical:  This patient presents for endoscopic testing for: Encounter Diagnoses  Name Primary?   Hx of colonic polyps Yes   Esophageal dysphagia    Rectal bleeding     Clinical details in 04/28/24 office APP consult note  Underwent colonoscopy 03/20/2022 with finding of an 18 x 4 mm adenomatous polyp in the proximal ascending colon, and 4 diminutive adenomatous polyps in the ascending colon. No colitis seen. Recommended recall 2 years.   Having several months of rectal bleeding and intermittent dysphagia with o/w stable chronic GERD symptoms.  Patient is otherwise without complaints or active issues today.   Past Medical History: Past Medical History:  Diagnosis Date   Agatston coronary artery calcium  score between 200 and 399    Score 205 on 06/2022   Allergy    Anemia    Anxiety    Arthritis    Asthma    Carpal tunnel syndrome    GASTROESOPHAGEAL REFLUX DISEASE 05/11/2007   Qualifier: Diagnosis of  By: Loretha MD, Victoria     Hypertension      Past Surgical History: Past Surgical History:  Procedure Laterality Date   ANTERIOR CRUCIATE LIGAMENT REPAIR Left    2018   BACK SURGERY     COLONOSCOPY  07/20/2011   Procedure: COLONOSCOPY;  Surgeon: Oliva FORBES Boots, MD;  Location: [facility] END[facility]COPY;  Service: Endoscopy;  Laterality: N/A;   COLONOSCOPY  03/2022   mult polyps, lipoma.  Dr. Victory Brand   ENDOMETRIAL ABLATION  2006   NECK SURGERY     x3    Allergies: Allergies  Allergen Reactions   Tramadol Hcl Itching and Nausea Only   Trazodone And Nefazodone     intolerance   Latex Itching and Rash    Outpatient Meds: Current Outpatient Medications  Medication Sig Dispense Refill   amLODipine  (NORVASC ) 10 MG tablet Take 1 tablet (10 mg total) by mouth daily. 90 tablet 1   aspirin  EC 81 MG tablet Take 1 tablet (81 mg total) by mouth daily. Swallow whole. 90 tablet 3   celecoxib  (CELEBREX ) 200 MG capsule TAKE ONE CAPSULE BY MOUTH TWICE DAILY between meals 60  capsule 1   fluticasone  (FLONASE ) 50 MCG/ACT nasal spray Place 2 sprays into both nostrils daily. 18 mL 0   Ibuprofen  (MOTRIN  PO) Take by mouth.     ipratropium (ATROVENT ) 0.06 % nasal spray Place 2 sprays into both nostrils 4 (four) times daily. As needed for nasal congestion, runny nose 15 mL 0   sertraline (ZOLOFT) 100 MG tablet Take 100 mg by mouth daily.     albuterol  (VENTOLIN  HFA) 108 (90 Base) MCG/ACT inhaler ProAir  HFA 90 mcg/actuation aerosol inhaler     busPIRone  (BUSPAR ) 5 MG tablet Take 1 tablet (5 mg total) by mouth at bedtime as needed. 90 tablet 0   cetirizine  (ZYRTEC  ALLERGY) 10 MG tablet Take 1 tablet (10 mg total) by mouth at bedtime. 30 tablet 0   ergocalciferol  (VITAMIN D2) 1.25 MG (50000 UT) capsule ergocalciferol  (vitamin D2) 1,250 mcg (50,000 unit) capsule  TAKE ONE CAPSULE BY MOUTH Once weekly     esomeprazole (NEXIUM) 40 MG capsule esomeprazole magnesium 40 mg capsule,delayed release  TAKE ONE CAPSULE BY MOUTH EVERY DAY     ferrous gluconate  (FERGON) 324 MG tablet Take 1 tablet (324 mg total) by mouth daily with breakfast. 90 tablet 1   hydrocortisone  (ANUSOL -HC) 25 MG suppository Place 1 suppository (25 mg total) rectally at bedtime. Use for 3 to 5 days  12 suppository 1   methocarbamol  (ROBAXIN ) 500 MG tablet TAKE ONE TABLET BY MOUTH EVERY SIX HOURS AS NEEDED 40 tablet 1   methylPREDNISolone  (MEDROL ) 4 MG tablet Medrol  dose pack. Take as instructed 21 tablet 0   rosuvastatin  (CRESTOR ) 10 MG tablet Take 1 tablet (10 mg total) by mouth daily. 90 tablet 3   Current Facility-Administered Medications  Medication Dose Route Frequency Provider Last Rate Last Admin   0.9 %  sodium chloride  infusion  500 mL Intravenous Continuous Danis, Victory CROME III, MD          ___________________________________________________________________ Objective   Exam:  BP (!) 160/85   Pulse (!) 58   Temp (!) 97.2 F (36.2 C)   Ht 5' 5 (1.651 m)   Wt 186 lb (84.4 kg)   SpO2 100%   BMI  30.95 kg/m   CV: regular , S1/S2 Resp: clear to auscultation bilaterally, normal RR and effort noted GI: soft, no tenderness, with active bowel sounds.   Assessment: Encounter Diagnoses  Name Primary?   Hx of colonic polyps Yes   Esophageal dysphagia    Rectal bleeding      Plan: Colonoscopy EGD with possible dilation  The benefits and risks of the planned procedure(s) were described in detail with the patient or (when appropriate) their health care proxy.  Risks were outlined as including, but not limited to, bleeding, infection, perforation, adverse medication reaction leading to cardiac or pulmonary decompensation, pancreatitis (if ERCP).  The limitation of incomplete mucosal visualization was also discussed.  No guarantees or warranties were given.  The patient is appropriate for an endoscopic procedure in the ambulatory setting.   - Victory Brand, MD

## 2024-06-09 NOTE — Op Note (Signed)
 [facility] Patient Name: Teresa Mcmahon Procedure Date: 06/09/2024 1:20 PM MRN: [id number] Endoscopist: Victory L. Legrand , MD, [id number] Age: 63 Referring MD:  Date of Birth: 04-Aug-1961 Gender: Female Account #: [id number] Procedure:                Colonoscopy Indications:              Rectal bleeding Medicines:                Monitored Anesthesia Care Procedure:                Pre-Anesthesia Assessment:                           - Prior to the procedure, a History and Physical                            was performed, and patient medications and                            allergies were reviewed. The patient's tolerance of                            previous anesthesia was also reviewed. The risks                            and benefits of the procedure and the sedation                            options and risks were discussed with the patient.                            All questions were answered, and informed consent                            was obtained. Prior Anticoagulants: The patient has                            taken no anticoagulant or antiplatelet agents. ASA                            Grade Assessment: II - A patient with mild systemic                            disease. After reviewing the risks and benefits,                            the patient was deemed in satisfactory condition to                            undergo the procedure.                           After obtaining informed consent, the colonoscope  was passed under direct vision. Throughout the                            procedure, the patient's blood pressure, pulse, and                            oxygen saturations were monitored continuously. The                            Olympus Scope [id number] was introduced through the                            anus and advanced to the the cecum, identified by                            appendiceal orifice and  ileocecal valve. The                            colonoscopy was performed without difficulty. The                            patient tolerated the procedure well. The quality                            of the bowel preparation was good. The ileocecal                            valve, appendiceal orifice, and rectum were                            photographed. Scope In: 1:22:42 PM Scope Out: 1:42:58 PM Scope Withdrawal Time: 0 hours 16 minutes 45 seconds  Total Procedure Duration: 0 hours 20 minutes 16 seconds  Findings:                 The perianal and digital rectal examinations were                            normal.                           Repeat examination of right colon under NBI                            performed.                           A 2 mm polyp was found in the cecum. The polyp was                            semi-sessile. The polyp was removed with a cold                            biopsy forceps. Resection and retrieval were  complete. (Jar 1)                           Three sessile polyps were found in the ascending                            colon. The polyps were 3 to 5 mm in size. These                            polyps were removed with a cold snare. Resection                            and retrieval were complete. (Jar 2)                           A few small-mouthed diverticula were found in the                            left colon.                           An area of circumferential erythematous mucosa was                            found in the distal rectum. Biopsies were taken                            with a cold forceps for histology. (Rule out                            prolapse versus prep artifact) -jar 3                           Internal hemorrhoids were found. The hemorrhoids                            were small.                           The exam was otherwise without abnormality on                            direct  and retroflexion views. Complications:            No immediate complications. Estimated Blood Loss:     Estimated blood loss was minimal. Impression:               - One 2 mm polyp in the cecum, removed with a cold                            biopsy forceps. Resected and retrieved.                           - Three 3 to 5 mm polyps in the ascending colon,  removed with a cold snare. Resected and retrieved.                           - Diverticulosis in the left colon.                           - Erythematous mucosa in the distal rectum.                            Biopsied.                           - Internal hemorrhoids.                           - The examination was otherwise normal on direct                            and retroflexion views. Recommendation:           - Patient has a contact number available for                            emergencies. The signs and symptoms of potential                            delayed complications were discussed with the                            patient. Return to normal activities tomorrow.                            Written discharge instructions were provided to the                            patient.                           - Resume previous diet.                           - Continue present medications.                           - Await pathology results.                           - Repeat colonoscopy is recommended for                            surveillance. The colonoscopy date will be                            determined after pathology results from today's                            exam become available for review.                           -  Preparation H suppository: Insert rectally as                            necessary.                           - See the other procedure note for documentation of                            additional recommendations. Lisette Mancebo L. Legrand, MD 06/09/2024 2:05:44 PM This  report has been signed electronically.

## 2024-06-09 NOTE — Progress Notes (Signed)
 Report given to PACU, vss

## 2024-06-09 NOTE — Progress Notes (Signed)
 1313 Robinul 0.1 mg IV given due large amount of secretions upon assessment.  MD made aware, vss

## 2024-06-12 ENCOUNTER — Telehealth: Payer: Self-pay

## 2024-06-12 ENCOUNTER — Encounter: Payer: Self-pay | Admitting: Radiology

## 2024-06-12 NOTE — Telephone Encounter (Signed)
  Follow up Call-     06/09/2024   12:37 PM 03/20/2022    1:42 PM  Call back number  Post procedure Call Back phone  # [phone]  Permission to leave phone message Yes Yes     Patient questions:  Do you have a fever, pain , or abdominal swelling? No. Pain Score  0 *  Have you tolerated food without any problems? Yes.    Have you been able to return to your normal activities? Yes.    Do you have any questions about your discharge instructions: Diet   No. Medications  No. Follow up visit  No.  Do you have questions or concerns about your Care? No.  Actions: * If pain score is 4 or above: No action needed, pain <4.

## 2024-06-14 LAB — SURGICAL PATHOLOGY

## 2024-06-15 ENCOUNTER — Ambulatory Visit: Payer: Self-pay | Admitting: Gastroenterology

## 2024-06-16 NOTE — Telephone Encounter (Signed)
 Spoke with [facility] and sent your request for pathology to be reevaluated.

## 2024-06-27 ENCOUNTER — Encounter: Payer: Self-pay | Admitting: Gastroenterology

## 2024-07-27 ENCOUNTER — Ambulatory Visit: Admitting: Podiatry

## 2024-07-27 DIAGNOSIS — M7662 Achilles tendinitis, left leg: Secondary | ICD-10-CM | POA: Diagnosis not present

## 2024-07-27 DIAGNOSIS — M722 Plantar fascial fibromatosis: Secondary | ICD-10-CM

## 2024-07-27 MED ORDER — TRIAMCINOLONE ACETONIDE 10 MG/ML IJ SUSP
10.0000 mg | Freq: Once | INTRAMUSCULAR | Status: AC
  Administered 2024-07-27: 14:00:00 10 mg via INTRA_ARTICULAR

## 2024-07-27 NOTE — Progress Notes (Signed)
 Subjective:   Patient ID: Teresa Mcmahon, female   DOB: 63 y.o.   MRN: [id number]   HPI Patient states she is getting a lot of pain in the bottom of the left heel and is now developing pain in the back of the left heel and states that gradually is becoming more more of an issue for her.  States that it is hard for her to bear weight on that foot properly   ROS      Objective:  Physical Exam  Neurovascular status intact muscle strength found to be adequate range of motion within normal limits with exquisite discomfort in the plantar aspect of the left heel but also is now developing posterior pain on the lateral side which is probably compensatory between the 2 is having a lot of difficulties with ambulation     Assessment:  Severe plantar fasciitis left with inflammation of the posterior heel left secondary to compensatory leg pain     Plan:  Reviewed condition at great length explained condition and compensation and at this point from the lateral side I did reinject the plantar fascia 3 mg Kenalog  5 mg Xylocaine .  Patient tolerated this well and I did at this time dispensed air fracture walker to completely immobilize and take some of the strain off of her foot.  Patient tolerates this well will wear this full-time the next 3 weeks and be seen back at that time signed visit signed visit

## 2024-08-17 ENCOUNTER — Ambulatory Visit: Admitting: Podiatry
